# Patient Record
Sex: Male | Born: 1940
Health system: Southern US, Community
[De-identification: ages and names within clinical notes are randomized; demographics above are authoritative.]

## PROBLEM LIST (undated history)

## (undated) DIAGNOSIS — E785 Hyperlipidemia, unspecified: Secondary | ICD-10-CM

## (undated) DIAGNOSIS — F329 Major depressive disorder, single episode, unspecified: Secondary | ICD-10-CM

## (undated) DIAGNOSIS — F419 Anxiety disorder, unspecified: Secondary | ICD-10-CM

## (undated) DIAGNOSIS — G4733 Obstructive sleep apnea (adult) (pediatric): Secondary | ICD-10-CM

## (undated) DIAGNOSIS — Z972 Presence of dental prosthetic device (complete) (partial): Secondary | ICD-10-CM

## (undated) DIAGNOSIS — I10 Essential (primary) hypertension: Secondary | ICD-10-CM

## (undated) DIAGNOSIS — Z8781 Personal history of (healed) traumatic fracture: Secondary | ICD-10-CM

## (undated) DIAGNOSIS — Z9989 Dependence on other enabling machines and devices: Secondary | ICD-10-CM

## (undated) DIAGNOSIS — I447 Left bundle-branch block, unspecified: Secondary | ICD-10-CM

## (undated) DIAGNOSIS — H3530 Unspecified macular degeneration: Secondary | ICD-10-CM

## (undated) DIAGNOSIS — Z973 Presence of spectacles and contact lenses: Secondary | ICD-10-CM

## (undated) DIAGNOSIS — K219 Gastro-esophageal reflux disease without esophagitis: Secondary | ICD-10-CM

## (undated) DIAGNOSIS — K08109 Complete loss of teeth, unspecified cause, unspecified class: Secondary | ICD-10-CM

## (undated) DIAGNOSIS — C61 Malignant neoplasm of prostate: Secondary | ICD-10-CM

## (undated) DIAGNOSIS — Z9221 Personal history of antineoplastic chemotherapy: Secondary | ICD-10-CM

## (undated) DIAGNOSIS — E291 Testicular hypofunction: Secondary | ICD-10-CM

## (undated) DIAGNOSIS — R399 Unspecified symptoms and signs involving the genitourinary system: Secondary | ICD-10-CM

## (undated) DIAGNOSIS — F32A Depression, unspecified: Secondary | ICD-10-CM

## (undated) DIAGNOSIS — E349 Endocrine disorder, unspecified: Secondary | ICD-10-CM

## (undated) DIAGNOSIS — N200 Calculus of kidney: Secondary | ICD-10-CM

## (undated) DIAGNOSIS — M1990 Unspecified osteoarthritis, unspecified site: Secondary | ICD-10-CM

## (undated) HISTORY — PX: OTHER SURGICAL HISTORY: SHX169

## (undated) HISTORY — PX: APPENDECTOMY: SHX54

## (undated) HISTORY — PX: CARPAL TUNNEL RELEASE: SHX101

## (undated) HISTORY — PX: SHOULDER SURGERY: SHX246

## (undated) HISTORY — PX: LUMBAR SPINE SURGERY: SHX701

## (undated) HISTORY — PX: TONSILLECTOMY AND ADENOIDECTOMY: SUR1326

## (undated) HISTORY — PX: EYE SURGERY: SHX253

---

## 1991-08-21 HISTORY — PX: RETROPUBIC PROSTATECTOMY: SUR1055

## 1998-01-11 ENCOUNTER — Other Ambulatory Visit: Admission: RE | Admit: 1998-01-11 | Discharge: 1998-01-11 | Payer: Self-pay | Admitting: Urology

## 2007-08-01 ENCOUNTER — Ambulatory Visit (HOSPITAL_BASED_OUTPATIENT_CLINIC_OR_DEPARTMENT_OTHER): Admission: RE | Admit: 2007-08-01 | Discharge: 2007-08-01 | Payer: Self-pay | Admitting: Urology

## 2007-08-01 ENCOUNTER — Encounter (INDEPENDENT_AMBULATORY_CARE_PROVIDER_SITE_OTHER): Payer: Self-pay | Admitting: Urology

## 2007-08-01 HISTORY — PX: RADICAL ORCHIECTOMY: SHX2285

## 2008-07-07 ENCOUNTER — Ambulatory Visit (HOSPITAL_COMMUNITY): Admission: RE | Admit: 2008-07-07 | Discharge: 2008-07-07 | Payer: Self-pay | Admitting: Urology

## 2010-11-15 ENCOUNTER — Ambulatory Visit (HOSPITAL_BASED_OUTPATIENT_CLINIC_OR_DEPARTMENT_OTHER)
Admission: RE | Admit: 2010-11-15 | Discharge: 2010-11-15 | Disposition: A | Discharge status: Home / Self Care | Payer: Medicare Other | Source: Ambulatory Visit | Attending: Orthopedic Surgery | Admitting: Orthopedic Surgery

## 2010-11-15 DIAGNOSIS — X58XXXA Exposure to other specified factors, initial encounter: Secondary | ICD-10-CM | POA: Insufficient documentation

## 2010-11-15 DIAGNOSIS — Z01812 Encounter for preprocedural laboratory examination: Secondary | ICD-10-CM | POA: Insufficient documentation

## 2010-11-15 DIAGNOSIS — IMO0002 Reserved for concepts with insufficient information to code with codable children: Secondary | ICD-10-CM | POA: Insufficient documentation

## 2010-11-15 DIAGNOSIS — Z01810 Encounter for preprocedural cardiovascular examination: Secondary | ICD-10-CM | POA: Insufficient documentation

## 2010-11-15 HISTORY — PX: KNEE ARTHROSCOPY: SUR90

## 2010-11-15 LAB — POCT I-STAT 4, (NA,K, GLUC, HGB,HCT)
Potassium: 3.8 mEq/L (ref 3.5–5.1)
Sodium: 143 mEq/L (ref 135–145)

## 2010-11-21 NOTE — Op Note (Signed)
  NAME:  Allen Rogers, Allen Rogers              ACCOUNT NO.:  [id number]  MEDICAL RECORD NO.:  [id number]           PATIENT TYPE:  AMB  LOCATION:                               FACILITY:  [facility]  PHYSICIAN:  Ollen Gross, M.D.    DATE OF BIRTH:  09-09-1940  DATE OF PROCEDURE:  11/15/2010 DATE OF DISCHARGE:                              OPERATIVE REPORT   PREOPERATIVE DIAGNOSIS:  Right knee medial meniscal tear.  POSTOPERATIVE DIAGNOSIS:  Right knee medial meniscal tear.  PROCEDURE:  Right knee arthroscopy with meniscal debridement.  SURGEON:  Ollen Gross, M.D.  ASSISTANT.:  None.  ANESTHESIA:  General.  ESTIMATED BLOOD LOSS:  Minimal.  DRAINS:  None.  COMPLICATIONS:  None.  CONDITION.:  Stable to recovery.  BRIEF CLINICAL NOTE:  Mr. Firkus is a 70 year old male who has several month history significant for right knee pain and mechanical symptoms. Exam and history suggested medial meniscal tear confirmed by MRI.  He presents for arthroscopy and debridement.  PROCEDURE IN DETAIL:  After successful administration of general anesthetic, a tourniquet was placed high on the right thigh and right lower extremity prepped and draped in the usual sterile fashion. Standard superomedial and inferolateral incisions were made, inflow cannula passed, superomedial camera passed inferolateral.  Arthroscopic visualization proceeds.  Undersurface of patella and trochlea had some grade 2 and 3 changes but no full-thickness defects and no unstable cartilage.  Medial and lateral gutters were visualized and there were no loose bodies.  Flexion valgus force was applied to knee and the medial compartment centered.  There was significant tear in the body and posterior horn of medial meniscus which is unstable.  Spinal needle was used to localize the inferomedial portal.  Small incision was made, dilator placed.  Meniscus was debrided back to stable base with baskets and 4.2 mm shaver and sealed off with  the ArthroCare device.  The medial femoral condyle and medial tibial plateau had some grade 2 change but no full-thickness defects and no unstable cartilage.  Intercondylar notch was visualized.  The ACL was normal.  Lateral compartment was entered and it looked normal.  Joints again inspected.  No other tears, defects or loose bodies noted.  Arthroscopic equipment was then removed from the inferior portals which were closed with interrupted 4-0 nylon.  A 20 cc of 0.25% Marcaine with epi injected through the inflow cannula and that was removed and that portal closed with nylon.  A bulky sterile dressing was then applied and he was awakened and transported to recovery in stable condition.     Ollen Gross, M.D.     FA/MEDQ  D:  11/15/2010  T:  11/15/2010  Job:  [id number]  Electronically Signed by Ollen Gross M.D. on 11/21/2010 07:14:49 AM

## 2011-01-02 NOTE — Op Note (Signed)
NAME:  Allen Rogers, Allen Rogers NO.:  [id number]   MEDICAL RECORD NO.:  [id number]          PATIENT TYPE:  AMB   LOCATION:  [facility]                         FACILITY:  [facility]   PHYSICIAN:  Heloise Purpura, MD      DATE OF BIRTH:  [birth year]   DATE OF PROCEDURE:  08/01/2007  DATE OF DISCHARGE:                               OPERATIVE REPORT   ATTENDING:  Heloise Purpura, MD.   ASSISTANT:  Dr. Allena Katz.   PREOPERATIVE DIAGNOSIS:  Prostate cancer.   POSTOPERATIVE DIAGNOSIS:  Prostate cancer.   PROCEDURE:  Bilateral simple orchiectomy    General   COMPLICATIONS:  None   EBL:  Minimal   INDICATIONS FOR PROCEDURE:  [name] is a 70 year old  gentleman with a history of prostate cancer status post radical  retropubic prostatectomy.  He has been on androgen deprivation  intermittently since approximately the late 90's for biochemically  recurrent prostate cancer.  Recently, he has demonstrated findings  consistent with the development of castrate-resistant disease.  After  discussion with him and the likelihood that he was going to require  longterm androgen deprivation the patient elected to undergo scrotal  orchiectomy which has been scheduled for him electively today.   PROCEDURE IN DETAIL:  The patient was brought back to the operating  room.  After performing a preoperative time out and receiving  preoperative antibiotics he was appropriately anesthetized with an LMA  anesthetic.  He was secured in the supine position on the operating room  table and after shaving his scrotum, he was prepped and draped in the  usual sterile fashion.  With his penis retracted superiorly a midline  raphe incision was made on the scrotal skin.  We then started by  identifying the left testicle and incising the dartos over it and  ultimately the tunica over it and delivering the testis into the  surgical field.  The testis was freed from the epididymis and the cord  was identified.   The cord was suture ligated in 3 separate steps and the  testicle was removed in its entirety.  There was no visible bleeding and  at this point the remaining tunica was closed over the remnant cord and  epididymis.  We then turned our attention to the right testicle.   The right testicle was identified under the dartos.  An incision was  made in the dartos through the tunica of the testis.  The testis was  delivered into the field and in much the same fashion the cord and  epididymis were identified and plains between it and the testicle were  freed.  At this point the cord structures were suture ligated in 3  separate ligations.  The stump was hemostatic.  The testis was sent for  analysis along with the left testicle.  At this point we closed the  tunica over the left cord and epididymis.  We then reapproximated the  dartos muscle and closed the skin.  Dermabond was placed over the wound and a pressure dressing was applied.  The penile retraction was removed and this ended the case.  Please note  Dr. Laverle Patter was present throughout the entirety of the case.  Estimated  blood loss minimal.  Urine output not recorded.  Drains none.  Specimens  bilateral testicles.     ______________________________  Allena Katz Dr      Heloise Purpura, MD  Electronically Signed    Hadley Pen  D:  08/01/2007  T:  08/01/2007  Job:  [id number]

## 2011-05-28 LAB — I-STAT 8, (EC8 V) (CONVERTED LAB)
BUN: 25 — ABNORMAL HIGH
HCT: 42
Hemoglobin: 14.3
Sodium: 139
pH, Ven: 7.43 — ABNORMAL HIGH

## 2011-09-04 DIAGNOSIS — G4700 Insomnia, unspecified: Secondary | ICD-10-CM | POA: Diagnosis not present

## 2011-09-04 DIAGNOSIS — M1990 Unspecified osteoarthritis, unspecified site: Secondary | ICD-10-CM | POA: Diagnosis not present

## 2011-09-04 DIAGNOSIS — C61 Malignant neoplasm of prostate: Secondary | ICD-10-CM | POA: Diagnosis not present

## 2011-09-04 DIAGNOSIS — Z6837 Body mass index (BMI) 37.0-37.9, adult: Secondary | ICD-10-CM | POA: Diagnosis not present

## 2011-09-04 DIAGNOSIS — R42 Dizziness and giddiness: Secondary | ICD-10-CM | POA: Diagnosis not present

## 2011-09-04 DIAGNOSIS — I10 Essential (primary) hypertension: Secondary | ICD-10-CM | POA: Diagnosis not present

## 2011-10-09 DIAGNOSIS — K219 Gastro-esophageal reflux disease without esophagitis: Secondary | ICD-10-CM | POA: Diagnosis not present

## 2011-10-09 DIAGNOSIS — Z6836 Body mass index (BMI) 36.0-36.9, adult: Secondary | ICD-10-CM | POA: Diagnosis not present

## 2011-10-09 DIAGNOSIS — I10 Essential (primary) hypertension: Secondary | ICD-10-CM | POA: Diagnosis not present

## 2011-10-09 DIAGNOSIS — C61 Malignant neoplasm of prostate: Secondary | ICD-10-CM | POA: Diagnosis not present

## 2011-10-09 DIAGNOSIS — E785 Hyperlipidemia, unspecified: Secondary | ICD-10-CM | POA: Diagnosis not present

## 2011-10-09 DIAGNOSIS — Z79899 Other long term (current) drug therapy: Secondary | ICD-10-CM | POA: Diagnosis not present

## 2011-10-16 DIAGNOSIS — C61 Malignant neoplasm of prostate: Secondary | ICD-10-CM | POA: Diagnosis not present

## 2011-10-16 DIAGNOSIS — E291 Testicular hypofunction: Secondary | ICD-10-CM | POA: Diagnosis not present

## 2011-11-05 DIAGNOSIS — R071 Chest pain on breathing: Secondary | ICD-10-CM | POA: Diagnosis not present

## 2011-11-05 DIAGNOSIS — G2581 Restless legs syndrome: Secondary | ICD-10-CM | POA: Diagnosis not present

## 2011-11-05 DIAGNOSIS — G4710 Hypersomnia, unspecified: Secondary | ICD-10-CM | POA: Diagnosis not present

## 2011-11-05 DIAGNOSIS — Z6836 Body mass index (BMI) 36.0-36.9, adult: Secondary | ICD-10-CM | POA: Diagnosis not present

## 2011-11-08 DIAGNOSIS — G4710 Hypersomnia, unspecified: Secondary | ICD-10-CM | POA: Diagnosis not present

## 2011-11-08 DIAGNOSIS — G4730 Sleep apnea, unspecified: Secondary | ICD-10-CM | POA: Diagnosis not present

## 2011-11-20 DIAGNOSIS — R5383 Other fatigue: Secondary | ICD-10-CM | POA: Diagnosis not present

## 2011-11-20 DIAGNOSIS — G4710 Hypersomnia, unspecified: Secondary | ICD-10-CM | POA: Diagnosis not present

## 2011-11-20 DIAGNOSIS — J310 Chronic rhinitis: Secondary | ICD-10-CM | POA: Diagnosis not present

## 2011-11-20 DIAGNOSIS — R0989 Other specified symptoms and signs involving the circulatory and respiratory systems: Secondary | ICD-10-CM | POA: Diagnosis not present

## 2011-11-20 DIAGNOSIS — E559 Vitamin D deficiency, unspecified: Secondary | ICD-10-CM | POA: Diagnosis not present

## 2011-11-20 DIAGNOSIS — G4730 Sleep apnea, unspecified: Secondary | ICD-10-CM | POA: Diagnosis not present

## 2011-11-20 DIAGNOSIS — R5381 Other malaise: Secondary | ICD-10-CM | POA: Diagnosis not present

## 2011-11-20 DIAGNOSIS — R0609 Other forms of dyspnea: Secondary | ICD-10-CM | POA: Diagnosis not present

## 2011-11-27 DIAGNOSIS — G4730 Sleep apnea, unspecified: Secondary | ICD-10-CM | POA: Diagnosis not present

## 2011-12-24 DIAGNOSIS — M779 Enthesopathy, unspecified: Secondary | ICD-10-CM | POA: Diagnosis not present

## 2011-12-25 DIAGNOSIS — M25569 Pain in unspecified knee: Secondary | ICD-10-CM | POA: Diagnosis not present

## 2012-01-02 DIAGNOSIS — G4733 Obstructive sleep apnea (adult) (pediatric): Secondary | ICD-10-CM | POA: Diagnosis not present

## 2012-01-15 DIAGNOSIS — G4730 Sleep apnea, unspecified: Secondary | ICD-10-CM | POA: Diagnosis not present

## 2012-01-15 DIAGNOSIS — R0609 Other forms of dyspnea: Secondary | ICD-10-CM | POA: Diagnosis not present

## 2012-01-15 DIAGNOSIS — R0989 Other specified symptoms and signs involving the circulatory and respiratory systems: Secondary | ICD-10-CM | POA: Diagnosis not present

## 2012-01-15 DIAGNOSIS — R5381 Other malaise: Secondary | ICD-10-CM | POA: Diagnosis not present

## 2012-01-15 DIAGNOSIS — G4710 Hypersomnia, unspecified: Secondary | ICD-10-CM | POA: Diagnosis not present

## 2012-01-15 DIAGNOSIS — G2581 Restless legs syndrome: Secondary | ICD-10-CM | POA: Diagnosis not present

## 2012-01-16 DIAGNOSIS — G4710 Hypersomnia, unspecified: Secondary | ICD-10-CM | POA: Diagnosis not present

## 2012-01-16 DIAGNOSIS — G4730 Sleep apnea, unspecified: Secondary | ICD-10-CM | POA: Diagnosis not present

## 2012-02-15 DIAGNOSIS — G4710 Hypersomnia, unspecified: Secondary | ICD-10-CM | POA: Diagnosis not present

## 2012-02-15 DIAGNOSIS — J449 Chronic obstructive pulmonary disease, unspecified: Secondary | ICD-10-CM | POA: Diagnosis not present

## 2012-02-15 DIAGNOSIS — G4730 Sleep apnea, unspecified: Secondary | ICD-10-CM | POA: Diagnosis not present

## 2012-02-15 DIAGNOSIS — G2581 Restless legs syndrome: Secondary | ICD-10-CM | POA: Diagnosis not present

## 2012-02-15 DIAGNOSIS — R5381 Other malaise: Secondary | ICD-10-CM | POA: Diagnosis not present

## 2012-02-15 DIAGNOSIS — R5383 Other fatigue: Secondary | ICD-10-CM | POA: Diagnosis not present

## 2012-02-18 DIAGNOSIS — G2581 Restless legs syndrome: Secondary | ICD-10-CM | POA: Diagnosis not present

## 2012-02-18 DIAGNOSIS — R5381 Other malaise: Secondary | ICD-10-CM | POA: Diagnosis not present

## 2012-02-18 DIAGNOSIS — J449 Chronic obstructive pulmonary disease, unspecified: Secondary | ICD-10-CM | POA: Diagnosis not present

## 2012-02-18 DIAGNOSIS — G4730 Sleep apnea, unspecified: Secondary | ICD-10-CM | POA: Diagnosis not present

## 2012-02-27 DIAGNOSIS — M899 Disorder of bone, unspecified: Secondary | ICD-10-CM | POA: Diagnosis not present

## 2012-02-27 DIAGNOSIS — C61 Malignant neoplasm of prostate: Secondary | ICD-10-CM | POA: Diagnosis not present

## 2012-02-27 DIAGNOSIS — E291 Testicular hypofunction: Secondary | ICD-10-CM | POA: Diagnosis not present

## 2012-03-06 DIAGNOSIS — G4730 Sleep apnea, unspecified: Secondary | ICD-10-CM | POA: Diagnosis not present

## 2012-03-06 DIAGNOSIS — R5381 Other malaise: Secondary | ICD-10-CM | POA: Diagnosis not present

## 2012-03-06 DIAGNOSIS — G2581 Restless legs syndrome: Secondary | ICD-10-CM | POA: Diagnosis not present

## 2012-03-06 DIAGNOSIS — J310 Chronic rhinitis: Secondary | ICD-10-CM | POA: Diagnosis not present

## 2012-03-06 DIAGNOSIS — R5383 Other fatigue: Secondary | ICD-10-CM | POA: Diagnosis not present

## 2012-03-07 DIAGNOSIS — G2581 Restless legs syndrome: Secondary | ICD-10-CM | POA: Diagnosis not present

## 2012-03-07 DIAGNOSIS — R5381 Other malaise: Secondary | ICD-10-CM | POA: Diagnosis not present

## 2012-03-07 DIAGNOSIS — G4710 Hypersomnia, unspecified: Secondary | ICD-10-CM | POA: Diagnosis not present

## 2012-03-07 DIAGNOSIS — J310 Chronic rhinitis: Secondary | ICD-10-CM | POA: Diagnosis not present

## 2012-04-08 DIAGNOSIS — C61 Malignant neoplasm of prostate: Secondary | ICD-10-CM | POA: Diagnosis not present

## 2012-04-08 DIAGNOSIS — E785 Hyperlipidemia, unspecified: Secondary | ICD-10-CM | POA: Diagnosis not present

## 2012-04-08 DIAGNOSIS — G894 Chronic pain syndrome: Secondary | ICD-10-CM | POA: Diagnosis not present

## 2012-04-08 DIAGNOSIS — K219 Gastro-esophageal reflux disease without esophagitis: Secondary | ICD-10-CM | POA: Diagnosis not present

## 2012-04-08 DIAGNOSIS — I10 Essential (primary) hypertension: Secondary | ICD-10-CM | POA: Diagnosis not present

## 2012-04-08 DIAGNOSIS — G2581 Restless legs syndrome: Secondary | ICD-10-CM | POA: Diagnosis not present

## 2012-07-16 DIAGNOSIS — C61 Malignant neoplasm of prostate: Secondary | ICD-10-CM | POA: Diagnosis not present

## 2012-09-11 DIAGNOSIS — C61 Malignant neoplasm of prostate: Secondary | ICD-10-CM | POA: Diagnosis not present

## 2012-10-10 DIAGNOSIS — K219 Gastro-esophageal reflux disease without esophagitis: Secondary | ICD-10-CM | POA: Diagnosis not present

## 2012-10-10 DIAGNOSIS — C61 Malignant neoplasm of prostate: Secondary | ICD-10-CM | POA: Diagnosis not present

## 2012-10-10 DIAGNOSIS — I10 Essential (primary) hypertension: Secondary | ICD-10-CM | POA: Diagnosis not present

## 2012-10-10 DIAGNOSIS — E785 Hyperlipidemia, unspecified: Secondary | ICD-10-CM | POA: Diagnosis not present

## 2012-11-28 ENCOUNTER — Other Ambulatory Visit (HOSPITAL_COMMUNITY): Payer: Self-pay | Admitting: Urology

## 2012-11-28 DIAGNOSIS — C61 Malignant neoplasm of prostate: Secondary | ICD-10-CM

## 2012-11-28 DIAGNOSIS — N3941 Urge incontinence: Secondary | ICD-10-CM | POA: Diagnosis not present

## 2012-11-28 DIAGNOSIS — M949 Disorder of cartilage, unspecified: Secondary | ICD-10-CM | POA: Diagnosis not present

## 2012-11-28 DIAGNOSIS — M899 Disorder of bone, unspecified: Secondary | ICD-10-CM | POA: Diagnosis not present

## 2012-12-11 ENCOUNTER — Encounter (HOSPITAL_COMMUNITY)
Admission: RE | Admit: 2012-12-11 | Discharge: 2012-12-11 | Disposition: A | Discharge status: Home / Self Care | Payer: Medicare Other | Source: Ambulatory Visit | Attending: Urology | Admitting: Urology

## 2012-12-11 DIAGNOSIS — M25529 Pain in unspecified elbow: Secondary | ICD-10-CM | POA: Diagnosis not present

## 2012-12-11 DIAGNOSIS — C61 Malignant neoplasm of prostate: Secondary | ICD-10-CM | POA: Insufficient documentation

## 2012-12-11 DIAGNOSIS — M25539 Pain in unspecified wrist: Secondary | ICD-10-CM | POA: Diagnosis not present

## 2012-12-11 DIAGNOSIS — M25569 Pain in unspecified knee: Secondary | ICD-10-CM | POA: Diagnosis not present

## 2012-12-11 DIAGNOSIS — K7689 Other specified diseases of liver: Secondary | ICD-10-CM | POA: Diagnosis not present

## 2012-12-11 MED ORDER — TECHNETIUM TC 99M MEDRONATE IV KIT
25.6000 | PACK | Freq: Once | INTRAVENOUS | Status: AC | PRN
  Administered 2012-12-11: 25.6 via INTRAVENOUS

## 2013-02-23 DIAGNOSIS — K219 Gastro-esophageal reflux disease without esophagitis: Secondary | ICD-10-CM | POA: Diagnosis not present

## 2013-02-23 DIAGNOSIS — E785 Hyperlipidemia, unspecified: Secondary | ICD-10-CM | POA: Diagnosis not present

## 2013-02-23 DIAGNOSIS — I10 Essential (primary) hypertension: Secondary | ICD-10-CM | POA: Diagnosis not present

## 2013-02-23 DIAGNOSIS — C61 Malignant neoplasm of prostate: Secondary | ICD-10-CM | POA: Diagnosis not present

## 2013-02-24 DIAGNOSIS — C61 Malignant neoplasm of prostate: Secondary | ICD-10-CM | POA: Diagnosis not present

## 2013-04-16 DIAGNOSIS — E785 Hyperlipidemia, unspecified: Secondary | ICD-10-CM | POA: Diagnosis not present

## 2013-04-16 DIAGNOSIS — Z6837 Body mass index (BMI) 37.0-37.9, adult: Secondary | ICD-10-CM | POA: Diagnosis not present

## 2013-04-16 DIAGNOSIS — Z9181 History of falling: Secondary | ICD-10-CM | POA: Diagnosis not present

## 2013-04-16 DIAGNOSIS — I10 Essential (primary) hypertension: Secondary | ICD-10-CM | POA: Diagnosis not present

## 2013-04-16 DIAGNOSIS — Z1331 Encounter for screening for depression: Secondary | ICD-10-CM | POA: Diagnosis not present

## 2013-04-16 DIAGNOSIS — K219 Gastro-esophageal reflux disease without esophagitis: Secondary | ICD-10-CM | POA: Diagnosis not present

## 2013-05-21 DIAGNOSIS — C61 Malignant neoplasm of prostate: Secondary | ICD-10-CM | POA: Diagnosis not present

## 2013-05-21 DIAGNOSIS — E785 Hyperlipidemia, unspecified: Secondary | ICD-10-CM | POA: Diagnosis not present

## 2013-06-03 DIAGNOSIS — M899 Disorder of bone, unspecified: Secondary | ICD-10-CM | POA: Diagnosis not present

## 2013-06-03 DIAGNOSIS — E291 Testicular hypofunction: Secondary | ICD-10-CM | POA: Diagnosis not present

## 2013-06-03 DIAGNOSIS — C61 Malignant neoplasm of prostate: Secondary | ICD-10-CM | POA: Diagnosis not present

## 2013-06-17 ENCOUNTER — Other Ambulatory Visit (HOSPITAL_COMMUNITY): Payer: Self-pay | Admitting: Urology

## 2013-06-17 DIAGNOSIS — C61 Malignant neoplasm of prostate: Secondary | ICD-10-CM

## 2013-06-23 DIAGNOSIS — Z23 Encounter for immunization: Secondary | ICD-10-CM | POA: Diagnosis not present

## 2013-06-29 ENCOUNTER — Ambulatory Visit (HOSPITAL_COMMUNITY)
Admission: RE | Admit: 2013-06-29 | Discharge: 2013-06-29 | Disposition: A | Discharge status: Home / Self Care | Payer: Medicare Other | Source: Ambulatory Visit | Attending: Urology | Admitting: Urology

## 2013-06-29 ENCOUNTER — Encounter: Payer: Self-pay | Admitting: Cardiovascular Disease

## 2013-06-29 ENCOUNTER — Encounter (HOSPITAL_COMMUNITY): Payer: Self-pay

## 2013-06-29 DIAGNOSIS — C61 Malignant neoplasm of prostate: Secondary | ICD-10-CM | POA: Insufficient documentation

## 2013-06-29 DIAGNOSIS — R51 Headache: Secondary | ICD-10-CM | POA: Insufficient documentation

## 2013-06-29 MED ORDER — IOHEXOL 300 MG/ML  SOLN
100.0000 mL | Freq: Once | INTRAMUSCULAR | Status: AC | PRN
  Administered 2013-06-29: 100 mL via INTRAVENOUS

## 2013-07-02 ENCOUNTER — Encounter (HOSPITAL_COMMUNITY)
Admission: RE | Admit: 2013-07-02 | Discharge: 2013-07-02 | Disposition: A | Discharge status: Home / Self Care | Payer: Medicare Other | Source: Ambulatory Visit | Attending: Urology | Admitting: Urology

## 2013-07-02 ENCOUNTER — Encounter (HOSPITAL_COMMUNITY): Payer: Self-pay

## 2013-07-02 DIAGNOSIS — C61 Malignant neoplasm of prostate: Secondary | ICD-10-CM | POA: Diagnosis not present

## 2013-07-02 DIAGNOSIS — R948 Abnormal results of function studies of other organs and systems: Secondary | ICD-10-CM | POA: Insufficient documentation

## 2013-07-02 MED ORDER — TECHNETIUM TC 99M MEDRONATE IV KIT
26.2000 | PACK | Freq: Once | INTRAVENOUS | Status: AC | PRN
  Administered 2013-07-02: 26.2 via INTRAVENOUS

## 2013-09-08 ENCOUNTER — Other Ambulatory Visit (HOSPITAL_COMMUNITY): Payer: Self-pay | Admitting: Urology

## 2013-09-08 DIAGNOSIS — C61 Malignant neoplasm of prostate: Secondary | ICD-10-CM

## 2013-11-02 ENCOUNTER — Encounter (HOSPITAL_COMMUNITY)
Admission: RE | Admit: 2013-11-02 | Discharge: 2013-11-02 | Disposition: A | Discharge status: Home / Self Care | Payer: Medicare Other | Source: Ambulatory Visit | Attending: Urology | Admitting: Urology

## 2013-11-02 DIAGNOSIS — C7952 Secondary malignant neoplasm of bone marrow: Secondary | ICD-10-CM

## 2013-11-02 DIAGNOSIS — C61 Malignant neoplasm of prostate: Secondary | ICD-10-CM | POA: Insufficient documentation

## 2013-11-02 DIAGNOSIS — C7951 Secondary malignant neoplasm of bone: Secondary | ICD-10-CM | POA: Insufficient documentation

## 2013-11-02 MED ORDER — TECHNETIUM TC 99M MEDRONATE IV KIT
26.5000 | PACK | Freq: Once | INTRAVENOUS | Status: AC | PRN
  Administered 2013-11-02: 26.5 via INTRAVENOUS

## 2013-11-24 DIAGNOSIS — H353190 Nonexudative age-related macular degeneration, unspecified eye, stage unspecified: Secondary | ICD-10-CM | POA: Diagnosis not present

## 2013-11-24 DIAGNOSIS — H40009 Preglaucoma, unspecified, unspecified eye: Secondary | ICD-10-CM | POA: Diagnosis not present

## 2013-11-24 DIAGNOSIS — H25019 Cortical age-related cataract, unspecified eye: Secondary | ICD-10-CM | POA: Diagnosis not present

## 2013-12-31 ENCOUNTER — Other Ambulatory Visit (HOSPITAL_COMMUNITY): Payer: Self-pay | Admitting: Urology

## 2013-12-31 DIAGNOSIS — C61 Malignant neoplasm of prostate: Secondary | ICD-10-CM | POA: Diagnosis not present

## 2013-12-31 DIAGNOSIS — M899 Disorder of bone, unspecified: Secondary | ICD-10-CM | POA: Diagnosis not present

## 2013-12-31 DIAGNOSIS — M949 Disorder of cartilage, unspecified: Secondary | ICD-10-CM | POA: Diagnosis not present

## 2013-12-31 DIAGNOSIS — E291 Testicular hypofunction: Secondary | ICD-10-CM | POA: Diagnosis not present

## 2014-02-18 ENCOUNTER — Encounter (HOSPITAL_COMMUNITY)
Admission: RE | Admit: 2014-02-18 | Discharge: 2014-02-18 | Disposition: A | Discharge status: Home / Self Care | Payer: Medicare Other | Source: Ambulatory Visit | Attending: Urology | Admitting: Urology

## 2014-02-18 DIAGNOSIS — C61 Malignant neoplasm of prostate: Secondary | ICD-10-CM | POA: Diagnosis not present

## 2014-02-18 MED ORDER — TECHNETIUM TC 99M MEDRONATE IV KIT
25.0000 | PACK | Freq: Once | INTRAVENOUS | Status: AC | PRN
  Administered 2014-02-18: 25 via INTRAVENOUS

## 2014-04-12 ENCOUNTER — Other Ambulatory Visit (HOSPITAL_COMMUNITY): Payer: Self-pay | Admitting: Urology

## 2014-04-12 DIAGNOSIS — C61 Malignant neoplasm of prostate: Secondary | ICD-10-CM

## 2014-06-14 ENCOUNTER — Encounter (HOSPITAL_COMMUNITY)
Admission: RE | Admit: 2014-06-14 | Discharge: 2014-06-14 | Disposition: A | Discharge status: Home / Self Care | Payer: Medicare Other | Source: Ambulatory Visit | Attending: Urology | Admitting: Urology

## 2014-06-14 DIAGNOSIS — C61 Malignant neoplasm of prostate: Secondary | ICD-10-CM | POA: Insufficient documentation

## 2014-06-14 MED ORDER — TECHNETIUM TC 99M MEDRONATE IV KIT
24.8000 | PACK | Freq: Once | INTRAVENOUS | Status: AC | PRN
  Administered 2014-06-14: 24.8 via INTRAVENOUS

## 2014-06-15 ENCOUNTER — Other Ambulatory Visit (HOSPITAL_COMMUNITY): Payer: Self-pay | Admitting: Urology

## 2014-06-15 DIAGNOSIS — C61 Malignant neoplasm of prostate: Secondary | ICD-10-CM

## 2014-06-24 DIAGNOSIS — C61 Malignant neoplasm of prostate: Secondary | ICD-10-CM | POA: Diagnosis not present

## 2014-06-24 DIAGNOSIS — G4700 Insomnia, unspecified: Secondary | ICD-10-CM | POA: Diagnosis not present

## 2014-06-24 DIAGNOSIS — I10 Essential (primary) hypertension: Secondary | ICD-10-CM | POA: Diagnosis not present

## 2014-06-24 DIAGNOSIS — Z6837 Body mass index (BMI) 37.0-37.9, adult: Secondary | ICD-10-CM | POA: Diagnosis not present

## 2014-06-24 DIAGNOSIS — E785 Hyperlipidemia, unspecified: Secondary | ICD-10-CM | POA: Diagnosis not present

## 2014-06-24 DIAGNOSIS — E559 Vitamin D deficiency, unspecified: Secondary | ICD-10-CM | POA: Diagnosis not present

## 2014-06-24 DIAGNOSIS — Z23 Encounter for immunization: Secondary | ICD-10-CM | POA: Diagnosis not present

## 2014-06-24 DIAGNOSIS — G894 Chronic pain syndrome: Secondary | ICD-10-CM | POA: Diagnosis not present

## 2014-10-04 ENCOUNTER — Encounter (HOSPITAL_COMMUNITY)
Admission: RE | Admit: 2014-10-04 | Discharge: 2014-10-04 | Disposition: A | Discharge status: Home / Self Care | Payer: Self-pay | Source: Ambulatory Visit | Attending: Urology | Admitting: Urology

## 2014-10-04 DIAGNOSIS — R911 Solitary pulmonary nodule: Secondary | ICD-10-CM | POA: Diagnosis not present

## 2014-10-04 DIAGNOSIS — C61 Malignant neoplasm of prostate: Secondary | ICD-10-CM

## 2014-10-04 DIAGNOSIS — Z9079 Acquired absence of other genital organ(s): Secondary | ICD-10-CM | POA: Diagnosis not present

## 2014-10-04 DIAGNOSIS — J9811 Atelectasis: Secondary | ICD-10-CM | POA: Diagnosis not present

## 2014-10-04 DIAGNOSIS — D7389 Other diseases of spleen: Secondary | ICD-10-CM | POA: Diagnosis not present

## 2014-10-04 DIAGNOSIS — I700 Atherosclerosis of aorta: Secondary | ICD-10-CM | POA: Diagnosis not present

## 2014-10-04 MED ORDER — TECHNETIUM TC 99M MEDRONATE IV KIT
26.6000 | PACK | Freq: Once | INTRAVENOUS | Status: AC | PRN
  Administered 2014-10-04: 26.6 via INTRAVENOUS

## 2014-12-03 ENCOUNTER — Other Ambulatory Visit (HOSPITAL_COMMUNITY): Payer: Self-pay | Admitting: Urology

## 2014-12-03 DIAGNOSIS — C61 Malignant neoplasm of prostate: Secondary | ICD-10-CM

## 2014-12-10 DIAGNOSIS — Z79899 Other long term (current) drug therapy: Secondary | ICD-10-CM | POA: Diagnosis not present

## 2014-12-10 DIAGNOSIS — I10 Essential (primary) hypertension: Secondary | ICD-10-CM | POA: Diagnosis not present

## 2014-12-10 DIAGNOSIS — Z23 Encounter for immunization: Secondary | ICD-10-CM | POA: Diagnosis not present

## 2014-12-10 DIAGNOSIS — E559 Vitamin D deficiency, unspecified: Secondary | ICD-10-CM | POA: Diagnosis not present

## 2014-12-10 DIAGNOSIS — C61 Malignant neoplasm of prostate: Secondary | ICD-10-CM | POA: Diagnosis not present

## 2014-12-10 DIAGNOSIS — Z6837 Body mass index (BMI) 37.0-37.9, adult: Secondary | ICD-10-CM | POA: Diagnosis not present

## 2014-12-10 DIAGNOSIS — E785 Hyperlipidemia, unspecified: Secondary | ICD-10-CM | POA: Diagnosis not present

## 2014-12-10 DIAGNOSIS — G894 Chronic pain syndrome: Secondary | ICD-10-CM | POA: Diagnosis not present

## 2014-12-10 DIAGNOSIS — F5104 Psychophysiologic insomnia: Secondary | ICD-10-CM | POA: Diagnosis not present

## 2015-01-20 ENCOUNTER — Encounter (HOSPITAL_COMMUNITY)
Admission: RE | Admit: 2015-01-20 | Discharge: 2015-01-20 | Disposition: A | Discharge status: Home / Self Care | Payer: Self-pay | Source: Ambulatory Visit | Attending: Urology | Admitting: Urology

## 2015-01-20 DIAGNOSIS — C61 Malignant neoplasm of prostate: Secondary | ICD-10-CM | POA: Insufficient documentation

## 2015-01-20 MED ORDER — TECHNETIUM TC 99M MEDRONATE IV KIT
27.2000 | PACK | Freq: Once | INTRAVENOUS | Status: AC | PRN
  Administered 2015-01-20: 27.2 via INTRAVENOUS

## 2015-02-03 ENCOUNTER — Other Ambulatory Visit (HOSPITAL_COMMUNITY): Payer: Self-pay | Admitting: Urology

## 2015-02-03 DIAGNOSIS — C61 Malignant neoplasm of prostate: Secondary | ICD-10-CM

## 2015-02-11 DIAGNOSIS — H16102 Unspecified superficial keratitis, left eye: Secondary | ICD-10-CM | POA: Diagnosis not present

## 2015-03-17 DIAGNOSIS — M25512 Pain in left shoulder: Secondary | ICD-10-CM | POA: Diagnosis not present

## 2015-03-17 DIAGNOSIS — M25561 Pain in right knee: Secondary | ICD-10-CM | POA: Diagnosis not present

## 2015-03-17 DIAGNOSIS — M25562 Pain in left knee: Secondary | ICD-10-CM | POA: Diagnosis not present

## 2015-03-17 DIAGNOSIS — M25511 Pain in right shoulder: Secondary | ICD-10-CM | POA: Diagnosis not present

## 2015-03-25 DIAGNOSIS — M19011 Primary osteoarthritis, right shoulder: Secondary | ICD-10-CM | POA: Diagnosis not present

## 2015-03-25 DIAGNOSIS — X58XXXA Exposure to other specified factors, initial encounter: Secondary | ICD-10-CM | POA: Diagnosis not present

## 2015-03-25 DIAGNOSIS — S46111A Strain of muscle, fascia and tendon of long head of biceps, right arm, initial encounter: Secondary | ICD-10-CM | POA: Diagnosis not present

## 2015-03-25 DIAGNOSIS — M75101 Unspecified rotator cuff tear or rupture of right shoulder, not specified as traumatic: Secondary | ICD-10-CM | POA: Diagnosis not present

## 2015-03-31 DIAGNOSIS — M19011 Primary osteoarthritis, right shoulder: Secondary | ICD-10-CM | POA: Diagnosis not present

## 2015-03-31 DIAGNOSIS — M75121 Complete rotator cuff tear or rupture of right shoulder, not specified as traumatic: Secondary | ICD-10-CM | POA: Diagnosis not present

## 2015-05-16 ENCOUNTER — Encounter (HOSPITAL_COMMUNITY)
Admission: RE | Admit: 2015-05-16 | Discharge: 2015-05-16 | Disposition: A | Discharge status: Home / Self Care | Payer: Self-pay | Source: Ambulatory Visit | Attending: Urology | Admitting: Urology

## 2015-05-16 DIAGNOSIS — C61 Malignant neoplasm of prostate: Secondary | ICD-10-CM | POA: Insufficient documentation

## 2015-05-16 MED ORDER — TECHNETIUM TC 99M MEDRONATE IV KIT
26.6000 | PACK | Freq: Once | INTRAVENOUS | Status: AC | PRN
  Administered 2015-05-16: 26.6 via INTRAVENOUS

## 2015-05-17 DIAGNOSIS — Z23 Encounter for immunization: Secondary | ICD-10-CM | POA: Diagnosis not present

## 2015-05-18 DIAGNOSIS — M8580 Other specified disorders of bone density and structure, unspecified site: Secondary | ICD-10-CM | POA: Diagnosis not present

## 2015-05-18 DIAGNOSIS — C61 Malignant neoplasm of prostate: Secondary | ICD-10-CM | POA: Diagnosis not present

## 2015-05-23 DIAGNOSIS — M65811 Other synovitis and tenosynovitis, right shoulder: Secondary | ICD-10-CM | POA: Diagnosis not present

## 2015-05-23 DIAGNOSIS — M25511 Pain in right shoulder: Secondary | ICD-10-CM | POA: Diagnosis not present

## 2015-05-23 DIAGNOSIS — M25512 Pain in left shoulder: Secondary | ICD-10-CM | POA: Diagnosis not present

## 2015-05-23 DIAGNOSIS — M7551 Bursitis of right shoulder: Secondary | ICD-10-CM | POA: Diagnosis not present

## 2015-05-23 DIAGNOSIS — M7541 Impingement syndrome of right shoulder: Secondary | ICD-10-CM | POA: Diagnosis not present

## 2015-05-23 DIAGNOSIS — M75121 Complete rotator cuff tear or rupture of right shoulder, not specified as traumatic: Secondary | ICD-10-CM | POA: Diagnosis not present

## 2015-05-23 DIAGNOSIS — M19011 Primary osteoarthritis, right shoulder: Secondary | ICD-10-CM | POA: Diagnosis not present

## 2015-05-23 DIAGNOSIS — Z87891 Personal history of nicotine dependence: Secondary | ICD-10-CM | POA: Diagnosis not present

## 2015-06-06 DIAGNOSIS — M7521 Bicipital tendinitis, right shoulder: Secondary | ICD-10-CM | POA: Diagnosis not present

## 2015-06-06 DIAGNOSIS — M7541 Impingement syndrome of right shoulder: Secondary | ICD-10-CM | POA: Diagnosis not present

## 2015-06-06 DIAGNOSIS — M75101 Unspecified rotator cuff tear or rupture of right shoulder, not specified as traumatic: Secondary | ICD-10-CM | POA: Diagnosis not present

## 2015-06-17 DIAGNOSIS — G894 Chronic pain syndrome: Secondary | ICD-10-CM | POA: Diagnosis not present

## 2015-06-17 DIAGNOSIS — Z79899 Other long term (current) drug therapy: Secondary | ICD-10-CM | POA: Diagnosis not present

## 2015-06-17 DIAGNOSIS — Z6836 Body mass index (BMI) 36.0-36.9, adult: Secondary | ICD-10-CM | POA: Diagnosis not present

## 2015-06-17 DIAGNOSIS — Z9181 History of falling: Secondary | ICD-10-CM | POA: Diagnosis not present

## 2015-06-17 DIAGNOSIS — E785 Hyperlipidemia, unspecified: Secondary | ICD-10-CM | POA: Diagnosis not present

## 2015-06-17 DIAGNOSIS — E559 Vitamin D deficiency, unspecified: Secondary | ICD-10-CM | POA: Diagnosis not present

## 2015-06-17 DIAGNOSIS — I10 Essential (primary) hypertension: Secondary | ICD-10-CM | POA: Diagnosis not present

## 2015-06-17 DIAGNOSIS — Z1389 Encounter for screening for other disorder: Secondary | ICD-10-CM | POA: Diagnosis not present

## 2015-06-17 DIAGNOSIS — F5104 Psychophysiologic insomnia: Secondary | ICD-10-CM | POA: Diagnosis not present

## 2015-06-28 ENCOUNTER — Other Ambulatory Visit (HOSPITAL_COMMUNITY): Payer: Self-pay | Admitting: Urology

## 2015-06-28 DIAGNOSIS — C61 Malignant neoplasm of prostate: Secondary | ICD-10-CM

## 2015-07-26 DIAGNOSIS — G4733 Obstructive sleep apnea (adult) (pediatric): Secondary | ICD-10-CM | POA: Diagnosis not present

## 2015-07-26 DIAGNOSIS — J4520 Mild intermittent asthma, uncomplicated: Secondary | ICD-10-CM | POA: Diagnosis not present

## 2015-07-26 DIAGNOSIS — G2581 Restless legs syndrome: Secondary | ICD-10-CM | POA: Diagnosis not present

## 2015-07-26 DIAGNOSIS — R5383 Other fatigue: Secondary | ICD-10-CM | POA: Diagnosis not present

## 2015-09-01 ENCOUNTER — Encounter (HOSPITAL_COMMUNITY)
Admission: RE | Admit: 2015-09-01 | Discharge: 2015-09-01 | Disposition: A | Discharge status: Home / Self Care | Payer: Self-pay | Source: Ambulatory Visit | Attending: Urology | Admitting: Urology

## 2015-09-01 DIAGNOSIS — C61 Malignant neoplasm of prostate: Secondary | ICD-10-CM | POA: Diagnosis not present

## 2015-09-01 MED ORDER — TECHNETIUM TC 99M MEDRONATE IV KIT
25.6000 | PACK | Freq: Once | INTRAVENOUS | Status: AC | PRN
  Administered 2015-09-01: 25.6 via INTRAVENOUS

## 2015-09-08 ENCOUNTER — Other Ambulatory Visit: Payer: Self-pay | Admitting: Urology

## 2015-09-08 ENCOUNTER — Other Ambulatory Visit (HOSPITAL_COMMUNITY): Payer: Self-pay | Admitting: Urology

## 2015-09-08 DIAGNOSIS — C61 Malignant neoplasm of prostate: Secondary | ICD-10-CM

## 2015-09-08 DIAGNOSIS — M8580 Other specified disorders of bone density and structure, unspecified site: Secondary | ICD-10-CM

## 2015-10-11 DIAGNOSIS — Z87891 Personal history of nicotine dependence: Secondary | ICD-10-CM | POA: Diagnosis not present

## 2015-10-11 DIAGNOSIS — M75121 Complete rotator cuff tear or rupture of right shoulder, not specified as traumatic: Secondary | ICD-10-CM | POA: Diagnosis not present

## 2015-10-11 DIAGNOSIS — M7521 Bicipital tendinitis, right shoulder: Secondary | ICD-10-CM | POA: Diagnosis not present

## 2015-10-11 DIAGNOSIS — M25511 Pain in right shoulder: Secondary | ICD-10-CM | POA: Diagnosis not present

## 2015-12-26 ENCOUNTER — Encounter (HOSPITAL_COMMUNITY)
Admission: RE | Admit: 2015-12-26 | Discharge: 2015-12-26 | Disposition: A | Discharge status: Home / Self Care | Payer: Medicare Other | Source: Ambulatory Visit | Attending: Urology | Admitting: Urology

## 2015-12-26 DIAGNOSIS — C61 Malignant neoplasm of prostate: Secondary | ICD-10-CM | POA: Insufficient documentation

## 2015-12-26 MED ORDER — TECHNETIUM TC 99M MEDRONATE IV KIT
25.0000 | PACK | Freq: Once | INTRAVENOUS | Status: AC | PRN
  Administered 2015-12-26: 25.5 via INTRAVENOUS

## 2015-12-30 DIAGNOSIS — Z6836 Body mass index (BMI) 36.0-36.9, adult: Secondary | ICD-10-CM | POA: Diagnosis not present

## 2015-12-30 DIAGNOSIS — Z79891 Long term (current) use of opiate analgesic: Secondary | ICD-10-CM | POA: Diagnosis not present

## 2015-12-30 DIAGNOSIS — Z23 Encounter for immunization: Secondary | ICD-10-CM | POA: Diagnosis not present

## 2015-12-30 DIAGNOSIS — E669 Obesity, unspecified: Secondary | ICD-10-CM | POA: Diagnosis not present

## 2015-12-30 DIAGNOSIS — Z1211 Encounter for screening for malignant neoplasm of colon: Secondary | ICD-10-CM | POA: Diagnosis not present

## 2015-12-30 DIAGNOSIS — F5104 Psychophysiologic insomnia: Secondary | ICD-10-CM | POA: Diagnosis not present

## 2015-12-30 DIAGNOSIS — E785 Hyperlipidemia, unspecified: Secondary | ICD-10-CM | POA: Diagnosis not present

## 2015-12-30 DIAGNOSIS — E559 Vitamin D deficiency, unspecified: Secondary | ICD-10-CM | POA: Diagnosis not present

## 2015-12-30 DIAGNOSIS — I10 Essential (primary) hypertension: Secondary | ICD-10-CM | POA: Diagnosis not present

## 2015-12-30 DIAGNOSIS — G894 Chronic pain syndrome: Secondary | ICD-10-CM | POA: Diagnosis not present

## 2016-01-02 ENCOUNTER — Other Ambulatory Visit (HOSPITAL_COMMUNITY): Payer: Self-pay | Admitting: Urology

## 2016-01-02 DIAGNOSIS — C61 Malignant neoplasm of prostate: Secondary | ICD-10-CM

## 2016-01-03 ENCOUNTER — Inpatient Hospital Stay: Admission: RE | Admit: 2016-01-03 | Payer: Self-pay | Source: Ambulatory Visit

## 2016-01-18 ENCOUNTER — Ambulatory Visit
Admission: RE | Admit: 2016-01-18 | Discharge: 2016-01-18 | Disposition: A | Discharge status: Home / Self Care | Payer: Medicare Other | Source: Ambulatory Visit | Attending: Urology | Admitting: Urology

## 2016-01-18 DIAGNOSIS — M8580 Other specified disorders of bone density and structure, unspecified site: Secondary | ICD-10-CM

## 2016-01-18 DIAGNOSIS — C61 Malignant neoplasm of prostate: Secondary | ICD-10-CM

## 2016-01-18 DIAGNOSIS — M85851 Other specified disorders of bone density and structure, right thigh: Secondary | ICD-10-CM | POA: Diagnosis not present

## 2016-01-23 DIAGNOSIS — Z01818 Encounter for other preprocedural examination: Secondary | ICD-10-CM | POA: Diagnosis not present

## 2016-01-23 DIAGNOSIS — Z1211 Encounter for screening for malignant neoplasm of colon: Secondary | ICD-10-CM | POA: Diagnosis not present

## 2016-01-26 DIAGNOSIS — H353211 Exudative age-related macular degeneration, right eye, with active choroidal neovascularization: Secondary | ICD-10-CM | POA: Diagnosis not present

## 2016-01-26 DIAGNOSIS — H5203 Hypermetropia, bilateral: Secondary | ICD-10-CM | POA: Diagnosis not present

## 2016-01-26 DIAGNOSIS — H40053 Ocular hypertension, bilateral: Secondary | ICD-10-CM | POA: Diagnosis not present

## 2016-01-30 DIAGNOSIS — H40052 Ocular hypertension, left eye: Secondary | ICD-10-CM | POA: Diagnosis not present

## 2016-01-30 DIAGNOSIS — H401321 Pigmentary glaucoma, left eye, mild stage: Secondary | ICD-10-CM | POA: Diagnosis not present

## 2016-01-30 DIAGNOSIS — H353122 Nonexudative age-related macular degeneration, left eye, intermediate dry stage: Secondary | ICD-10-CM | POA: Diagnosis not present

## 2016-01-30 DIAGNOSIS — Q132 Other congenital malformations of iris: Secondary | ICD-10-CM | POA: Diagnosis not present

## 2016-01-30 DIAGNOSIS — H353211 Exudative age-related macular degeneration, right eye, with active choroidal neovascularization: Secondary | ICD-10-CM | POA: Diagnosis not present

## 2016-02-09 DIAGNOSIS — D126 Benign neoplasm of colon, unspecified: Secondary | ICD-10-CM | POA: Diagnosis not present

## 2016-02-09 DIAGNOSIS — Z1211 Encounter for screening for malignant neoplasm of colon: Secondary | ICD-10-CM | POA: Diagnosis not present

## 2016-02-09 DIAGNOSIS — Z86010 Personal history of colonic polyps: Secondary | ICD-10-CM | POA: Diagnosis not present

## 2016-02-09 DIAGNOSIS — D128 Benign neoplasm of rectum: Secondary | ICD-10-CM | POA: Diagnosis not present

## 2016-02-09 DIAGNOSIS — K621 Rectal polyp: Secondary | ICD-10-CM | POA: Diagnosis not present

## 2016-02-09 DIAGNOSIS — J449 Chronic obstructive pulmonary disease, unspecified: Secondary | ICD-10-CM | POA: Diagnosis not present

## 2016-02-09 DIAGNOSIS — I10 Essential (primary) hypertension: Secondary | ICD-10-CM | POA: Diagnosis not present

## 2016-02-09 DIAGNOSIS — Z8546 Personal history of malignant neoplasm of prostate: Secondary | ICD-10-CM | POA: Diagnosis not present

## 2016-02-09 DIAGNOSIS — K5730 Diverticulosis of large intestine without perforation or abscess without bleeding: Secondary | ICD-10-CM | POA: Diagnosis not present

## 2016-02-09 DIAGNOSIS — D122 Benign neoplasm of ascending colon: Secondary | ICD-10-CM | POA: Diagnosis not present

## 2016-02-29 DIAGNOSIS — H40052 Ocular hypertension, left eye: Secondary | ICD-10-CM | POA: Diagnosis not present

## 2016-02-29 DIAGNOSIS — Q132 Other congenital malformations of iris: Secondary | ICD-10-CM | POA: Diagnosis not present

## 2016-02-29 DIAGNOSIS — H401321 Pigmentary glaucoma, left eye, mild stage: Secondary | ICD-10-CM | POA: Diagnosis not present

## 2016-02-29 DIAGNOSIS — H353211 Exudative age-related macular degeneration, right eye, with active choroidal neovascularization: Secondary | ICD-10-CM | POA: Diagnosis not present

## 2016-02-29 DIAGNOSIS — H353122 Nonexudative age-related macular degeneration, left eye, intermediate dry stage: Secondary | ICD-10-CM | POA: Diagnosis not present

## 2016-03-05 DIAGNOSIS — H353211 Exudative age-related macular degeneration, right eye, with active choroidal neovascularization: Secondary | ICD-10-CM | POA: Diagnosis not present

## 2016-03-05 DIAGNOSIS — Q132 Other congenital malformations of iris: Secondary | ICD-10-CM | POA: Diagnosis not present

## 2016-03-05 DIAGNOSIS — H401321 Pigmentary glaucoma, left eye, mild stage: Secondary | ICD-10-CM | POA: Diagnosis not present

## 2016-03-05 DIAGNOSIS — H353122 Nonexudative age-related macular degeneration, left eye, intermediate dry stage: Secondary | ICD-10-CM | POA: Diagnosis not present

## 2016-03-05 DIAGNOSIS — H31012 Macula scars of posterior pole (postinflammatory) (post-traumatic), left eye: Secondary | ICD-10-CM | POA: Diagnosis not present

## 2016-04-02 DIAGNOSIS — H353211 Exudative age-related macular degeneration, right eye, with active choroidal neovascularization: Secondary | ICD-10-CM | POA: Diagnosis not present

## 2016-04-12 ENCOUNTER — Encounter (HOSPITAL_COMMUNITY)
Admission: RE | Admit: 2016-04-12 | Discharge: 2016-04-12 | Disposition: A | Discharge status: Home / Self Care | Payer: Medicare Other | Source: Ambulatory Visit | Attending: Urology | Admitting: Urology

## 2016-04-12 ENCOUNTER — Encounter (HOSPITAL_COMMUNITY): Admission: RE | Admit: 2016-04-12 | Payer: Medicare Other | Source: Ambulatory Visit

## 2016-04-12 DIAGNOSIS — C61 Malignant neoplasm of prostate: Secondary | ICD-10-CM | POA: Insufficient documentation

## 2016-04-12 DIAGNOSIS — N201 Calculus of ureter: Secondary | ICD-10-CM | POA: Diagnosis not present

## 2016-04-12 MED ORDER — TECHNETIUM TC 99M MEDRONATE IV KIT
20.4000 | PACK | Freq: Once | INTRAVENOUS | Status: AC | PRN
  Administered 2016-04-12: 20.4 via INTRAVENOUS

## 2016-04-17 ENCOUNTER — Ambulatory Visit (HOSPITAL_BASED_OUTPATIENT_CLINIC_OR_DEPARTMENT_OTHER)
Admission: RE | Admit: 2016-04-17 | Discharge: 2016-04-17 | Disposition: A | Discharge status: Home / Self Care | Payer: Medicare Other | Source: Ambulatory Visit | Attending: Urology | Admitting: Urology

## 2016-04-17 ENCOUNTER — Ambulatory Visit (HOSPITAL_BASED_OUTPATIENT_CLINIC_OR_DEPARTMENT_OTHER): Payer: Medicare Other | Admitting: Anesthesiology

## 2016-04-17 ENCOUNTER — Other Ambulatory Visit: Payer: Self-pay | Admitting: Urology

## 2016-04-17 ENCOUNTER — Encounter (HOSPITAL_BASED_OUTPATIENT_CLINIC_OR_DEPARTMENT_OTHER)
Admission: RE | Disposition: A | Discharge status: Home / Self Care | Payer: Self-pay | Source: Ambulatory Visit | Attending: Urology

## 2016-04-17 ENCOUNTER — Encounter (HOSPITAL_BASED_OUTPATIENT_CLINIC_OR_DEPARTMENT_OTHER): Payer: Self-pay | Admitting: *Deleted

## 2016-04-17 ENCOUNTER — Other Ambulatory Visit: Payer: Self-pay

## 2016-04-17 DIAGNOSIS — Z9079 Acquired absence of other genital organ(s): Secondary | ICD-10-CM | POA: Diagnosis not present

## 2016-04-17 DIAGNOSIS — Z6833 Body mass index (BMI) 33.0-33.9, adult: Secondary | ICD-10-CM | POA: Diagnosis not present

## 2016-04-17 DIAGNOSIS — Z87891 Personal history of nicotine dependence: Secondary | ICD-10-CM | POA: Diagnosis not present

## 2016-04-17 DIAGNOSIS — N201 Calculus of ureter: Secondary | ICD-10-CM | POA: Insufficient documentation

## 2016-04-17 DIAGNOSIS — N200 Calculus of kidney: Secondary | ICD-10-CM

## 2016-04-17 DIAGNOSIS — I10 Essential (primary) hypertension: Secondary | ICD-10-CM | POA: Diagnosis not present

## 2016-04-17 DIAGNOSIS — E7800 Pure hypercholesterolemia, unspecified: Secondary | ICD-10-CM | POA: Diagnosis not present

## 2016-04-17 DIAGNOSIS — N23 Unspecified renal colic: Secondary | ICD-10-CM | POA: Diagnosis not present

## 2016-04-17 DIAGNOSIS — N3941 Urge incontinence: Secondary | ICD-10-CM | POA: Diagnosis not present

## 2016-04-17 DIAGNOSIS — Z006 Encounter for examination for normal comparison and control in clinical research program: Secondary | ICD-10-CM | POA: Insufficient documentation

## 2016-04-17 DIAGNOSIS — E291 Testicular hypofunction: Secondary | ICD-10-CM | POA: Diagnosis not present

## 2016-04-17 DIAGNOSIS — Z79899 Other long term (current) drug therapy: Secondary | ICD-10-CM | POA: Insufficient documentation

## 2016-04-17 DIAGNOSIS — C61 Malignant neoplasm of prostate: Secondary | ICD-10-CM | POA: Insufficient documentation

## 2016-04-17 HISTORY — DX: Dependence on other enabling machines and devices: Z99.89

## 2016-04-17 HISTORY — DX: Unspecified macular degeneration: H35.30

## 2016-04-17 HISTORY — DX: Malignant neoplasm of prostate: C61

## 2016-04-17 HISTORY — DX: Personal history of (healed) traumatic fracture: Z87.81

## 2016-04-17 HISTORY — PX: CYSTOSCOPY WITH RETROGRADE PYELOGRAM, URETEROSCOPY AND STENT PLACEMENT: SHX5789

## 2016-04-17 HISTORY — DX: Hyperlipidemia, unspecified: E78.5

## 2016-04-17 HISTORY — DX: Unspecified symptoms and signs involving the genitourinary system: R39.9

## 2016-04-17 HISTORY — DX: Unspecified osteoarthritis, unspecified site: M19.90

## 2016-04-17 HISTORY — DX: Presence of spectacles and contact lenses: Z97.3

## 2016-04-17 HISTORY — DX: Presence of dental prosthetic device (complete) (partial): Z97.2

## 2016-04-17 HISTORY — DX: Left bundle-branch block, unspecified: I44.7

## 2016-04-17 HISTORY — DX: Testicular hypofunction: E29.1

## 2016-04-17 HISTORY — DX: Endocrine disorder, unspecified: E34.9

## 2016-04-17 HISTORY — DX: Complete loss of teeth, unspecified cause, unspecified class: K08.109

## 2016-04-17 HISTORY — DX: Essential (primary) hypertension: I10

## 2016-04-17 HISTORY — DX: Obstructive sleep apnea (adult) (pediatric): G47.33

## 2016-04-17 HISTORY — DX: Gastro-esophageal reflux disease without esophagitis: K21.9

## 2016-04-17 LAB — POCT I-STAT, CHEM 8
BUN: 45 mg/dL — AB (ref 6–20)
CALCIUM ION: 1.36 mmol/L (ref 1.15–1.40)
CHLORIDE: 102 mmol/L (ref 101–111)
CREATININE: 1.3 mg/dL — AB (ref 0.61–1.24)
Glucose, Bld: 111 mg/dL — ABNORMAL HIGH (ref 65–99)
HCT: 36 % — ABNORMAL LOW (ref 39.0–52.0)
Hemoglobin: 12.2 g/dL — ABNORMAL LOW (ref 13.0–17.0)
Potassium: 4.2 mmol/L (ref 3.5–5.1)
SODIUM: 147 mmol/L — AB (ref 135–145)
TCO2: 33 mmol/L (ref 0–100)

## 2016-04-17 SURGERY — CYSTOURETEROSCOPY, WITH RETROGRADE PYELOGRAM AND STENT INSERTION
Anesthesia: General | Site: Ureter | Laterality: Right

## 2016-04-17 MED ORDER — PHENYLEPHRINE 40 MCG/ML (10ML) SYRINGE FOR IV PUSH (FOR BLOOD PRESSURE SUPPORT)
PREFILLED_SYRINGE | INTRAVENOUS | Status: AC
  Filled 2016-04-17: qty 10

## 2016-04-17 MED ORDER — CEFAZOLIN SODIUM-DEXTROSE 2-3 GM-% IV SOLR
INTRAVENOUS | Status: DC | PRN
  Administered 2016-04-17: 2 g via INTRAVENOUS

## 2016-04-17 MED ORDER — ONDANSETRON HCL 4 MG/2ML IJ SOLN
INTRAMUSCULAR | Status: DC | PRN
Start: 2016-04-17 — End: 2016-04-17
  Administered 2016-04-17: 4 mg via INTRAVENOUS

## 2016-04-17 MED ORDER — PHENYLEPHRINE HCL 10 MG/ML IJ SOLN
INTRAMUSCULAR | Status: DC | PRN
  Administered 2016-04-17 (×2): 80 ug via INTRAVENOUS

## 2016-04-17 MED ORDER — BELLADONNA ALKALOIDS-OPIUM 16.2-60 MG RE SUPP
RECTAL | Status: AC
  Filled 2016-04-17: qty 1

## 2016-04-17 MED ORDER — MIDAZOLAM HCL 2 MG/2ML IJ SOLN
0.5000 mg | Freq: Once | INTRAMUSCULAR | Status: DC | PRN
  Filled 2016-04-17: qty 2

## 2016-04-17 MED ORDER — PHENAZOPYRIDINE HCL 200 MG PO TABS
200.0000 mg | ORAL_TABLET | Freq: Once | ORAL | Status: AC
  Administered 2016-04-17: 200 mg via ORAL
  Filled 2016-04-17: qty 1

## 2016-04-17 MED ORDER — PHENAZOPYRIDINE HCL 200 MG PO TABS: 200.0000 mg | ORAL_TABLET | Freq: Three times a day (TID) | ORAL | 0 refills | Status: AC | PRN

## 2016-04-17 MED ORDER — FENTANYL CITRATE (PF) 100 MCG/2ML IJ SOLN
INTRAMUSCULAR | Status: DC | PRN
  Administered 2016-04-17 (×2): 25 ug via INTRAVENOUS
  Administered 2016-04-17: 50 ug via INTRAVENOUS

## 2016-04-17 MED ORDER — SODIUM CHLORIDE 0.9 % IR SOLN
Status: DC | PRN
  Administered 2016-04-17: 4000 mL

## 2016-04-17 MED ORDER — SUCCINYLCHOLINE CHLORIDE 20 MG/ML IJ SOLN
INTRAMUSCULAR | Status: DC | PRN
  Administered 2016-04-17: 80 mg via INTRAVENOUS

## 2016-04-17 MED ORDER — LIDOCAINE HCL 2 % EX GEL
CUTANEOUS | Status: DC | PRN
  Administered 2016-04-17: 1 via URETHRAL

## 2016-04-17 MED ORDER — PROPOFOL 10 MG/ML IV BOLUS
INTRAVENOUS | Status: DC | PRN
  Administered 2016-04-17: 160 mg via INTRAVENOUS
  Administered 2016-04-17: 50 mg via INTRAVENOUS
  Administered 2016-04-17: 40 mg via INTRAVENOUS
  Administered 2016-04-17: 50 mg via INTRAVENOUS

## 2016-04-17 MED ORDER — EPHEDRINE SULFATE 50 MG/ML IJ SOLN
INTRAMUSCULAR | Status: DC | PRN
  Administered 2016-04-17: 10 mg via INTRAVENOUS

## 2016-04-17 MED ORDER — FENTANYL CITRATE (PF) 100 MCG/2ML IJ SOLN
INTRAMUSCULAR | Status: AC
  Filled 2016-04-17: qty 2

## 2016-04-17 MED ORDER — DEXAMETHASONE SODIUM PHOSPHATE 4 MG/ML IJ SOLN
INTRAMUSCULAR | Status: DC | PRN
  Administered 2016-04-17: 10 mg via INTRAVENOUS

## 2016-04-17 MED ORDER — BELLADONNA ALKALOIDS-OPIUM 16.2-60 MG RE SUPP
RECTAL | Status: DC | PRN
  Administered 2016-04-17: 1 via RECTAL

## 2016-04-17 MED ORDER — CEFAZOLIN SODIUM-DEXTROSE 2-4 GM/100ML-% IV SOLN
INTRAVENOUS | Status: AC
  Filled 2016-04-17: qty 100

## 2016-04-17 MED ORDER — IOHEXOL 300 MG/ML  SOLN
INTRAMUSCULAR | Status: DC | PRN
  Administered 2016-04-17: 20 mL

## 2016-04-17 MED ORDER — LIDOCAINE 2% (20 MG/ML) 5 ML SYRINGE
INTRAMUSCULAR | Status: DC | PRN
  Administered 2016-04-17: 50 mg via INTRAVENOUS

## 2016-04-17 MED ORDER — FENTANYL CITRATE (PF) 100 MCG/2ML IJ SOLN
25.0000 ug | INTRAMUSCULAR | Status: DC | PRN
  Filled 2016-04-17: qty 1

## 2016-04-17 MED ORDER — ONDANSETRON HCL 4 MG/2ML IJ SOLN
4.0000 mg | Freq: Once | INTRAMUSCULAR | Status: AC
  Administered 2016-04-17: 4 mg via INTRAVENOUS
  Filled 2016-04-17: qty 2

## 2016-04-17 MED ORDER — MEPERIDINE HCL 25 MG/ML IJ SOLN
6.2500 mg | INTRAMUSCULAR | Status: DC | PRN
  Filled 2016-04-17: qty 1

## 2016-04-17 MED ORDER — PROMETHAZINE HCL 25 MG/ML IJ SOLN
6.2500 mg | INTRAMUSCULAR | Status: DC | PRN
  Filled 2016-04-17: qty 1

## 2016-04-17 MED ORDER — LIDOCAINE HCL (CARDIAC) 20 MG/ML IV SOLN
INTRAVENOUS | Status: AC
  Filled 2016-04-17: qty 5

## 2016-04-17 MED ORDER — LIDOCAINE HCL 2 % EX GEL
CUTANEOUS | Status: AC
  Filled 2016-04-17: qty 5

## 2016-04-17 MED ORDER — LACTATED RINGERS IV SOLN
INTRAVENOUS | Status: DC
  Administered 2016-04-17 (×2): via INTRAVENOUS
  Filled 2016-04-17: qty 1000

## 2016-04-17 MED ORDER — PHENAZOPYRIDINE HCL 100 MG PO TABS
ORAL_TABLET | ORAL | Status: AC
  Filled 2016-04-17: qty 2

## 2016-04-17 MED ORDER — ONDANSETRON HCL 4 MG/2ML IJ SOLN
INTRAMUSCULAR | Status: AC
  Filled 2016-04-17: qty 2

## 2016-04-17 MED ORDER — PROPOFOL 10 MG/ML IV BOLUS
INTRAVENOUS | Status: AC
  Filled 2016-04-17: qty 20

## 2016-04-17 MED ORDER — EPHEDRINE SULFATE 50 MG/ML IJ SOLN
INTRAMUSCULAR | Status: AC
  Filled 2016-04-17: qty 1

## 2016-04-17 SURGICAL SUPPLY — 31 items
BAG DRAIN URO-CYSTO SKYTR STRL (DRAIN) ×3 IMPLANT
BAG DRN UROCATH (DRAIN) ×1
BASKET DAKOTA 1.9FR 11X120 (BASKET) IMPLANT
CATH URET 5FR 28IN OPEN ENDED (CATHETERS) ×3 IMPLANT
CATH URET DUAL LUMEN 6-10FR 50 (CATHETERS) IMPLANT
CLOTH BEACON ORANGE TIMEOUT ST (SAFETY) ×3 IMPLANT
FIBER LASER TRAC TIP (UROLOGICAL SUPPLIES) IMPLANT
GLOVE BIO SURGEON STRL SZ7.5 (GLOVE) ×3 IMPLANT
GLOVE INDICATOR 7.5 STRL GRN (GLOVE) ×4 IMPLANT
GOWN STRL REUS W/ TWL XL LVL3 (GOWN DISPOSABLE) ×1 IMPLANT
GOWN STRL REUS W/TWL LRG LVL3 (GOWN DISPOSABLE) ×2 IMPLANT
GOWN STRL REUS W/TWL XL LVL3 (GOWN DISPOSABLE) ×3
GUIDEWIRE 0.038 PTFE COATED (WIRE) IMPLANT
GUIDEWIRE ANG ZIPWIRE 038X150 (WIRE) IMPLANT
GUIDEWIRE STR DUAL SENSOR (WIRE) ×6 IMPLANT
IV NS 1000ML (IV SOLUTION) ×3
IV NS 1000ML BAXH (IV SOLUTION) IMPLANT
IV NS IRRIG 3000ML ARTHROMATIC (IV SOLUTION) ×4 IMPLANT
KIT BALLIN UROMAX 15FX10 (LABEL) IMPLANT
KIT BALLN UROMAX 15FX4 (MISCELLANEOUS) IMPLANT
KIT BALLN UROMAX 26 75X4 (MISCELLANEOUS)
KIT ROOM TURNOVER WOR (KITS) ×3 IMPLANT
MANIFOLD NEPTUNE II (INSTRUMENTS) IMPLANT
NS IRRIG 500ML POUR BTL (IV SOLUTION) IMPLANT
PACK CYSTO (CUSTOM PROCEDURE TRAY) ×3 IMPLANT
SET HIGH PRES BAL DIL (LABEL)
SHEATH ACCESS URETERAL 38CM (SHEATH) IMPLANT
STENT URET 6FRX26 CONTOUR (STENTS) ×2 IMPLANT
TUBE CONNECTING 12'X1/4 (SUCTIONS)
TUBE CONNECTING 12X1/4 (SUCTIONS) IMPLANT
TUBE FEEDING 8FR 16IN STR KANG (MISCELLANEOUS) IMPLANT

## 2016-04-17 NOTE — Discharge Instructions (Signed)
°  Post Anesthesia Home Care Instructions  Activity: Get plenty of rest for the remainder of the day. A responsible adult should stay with you for 24 hours following the procedure.  For the next 24 hours, DO NOT: -Drive a car -Paediatric nurse -Drink alcoholic beverages -Take any medication unless instructed by your physician -Make any legal decisions or sign important papers.  Meals: Start with liquid foods such as gelatin or soup. Progress to regular foods as tolerated. Avoid greasy, spicy, heavy foods. If nausea and/or vomiting occur, drink only clear liquids until the nausea and/or vomiting subsides. Call your physician if vomiting continues.  Special Instructions/Symptoms: Your throat may feel dry or sore from the anesthesia or the breathing tube placed in your throat during surgery. If this causes discomfort, gargle with warm salt water. The discomfort should disappear within 24 hours.  If you had a scopolamine patch placed behind your ear for the management of post- operative nausea and/or vomiting:  1. The medication in the patch is effective for 72 hours, after which it should be removed.  Wrap patch in a tissue and discard in the trash. Wash hands thoroughly with soap and water. 2. You may remove the patch earlier than 72 hours if you experience unpleasant side effects which may include dry mouth, dizziness or visual disturbances. 3. Avoid touching the patch. Wash your hands with soap and water after contact with the patch.   DISCHARGE INSTRUCTIONS FOR KIDNEY STONE/URETERAL STENT   MEDICATIONS:  1.  Resume all your other meds from home - except do not take any extra narcotic pain meds that you may have at home.  2. Pyridium is to help with the burning/stinging when you urinate.   ACTIVITY:  1. No strenuous activity x 1week  2. No driving while on narcotic pain medications  3. Drink plenty of water  4. Continue to walk at home - you can still get blood clots when you are at  home, so keep active, but don't over do it.    SIGNS/SYMPTOMS TO CALL:  Please call us if you have a fever greater than 101.5, uncontrolled nausea/vomiting, uncontrolled pain, dizziness, unable to urinate, bloody urine, chest pain, shortness of breath, leg swelling, leg pain, redness around wound, drainage from wound, or any other concerns or questions.   You can reach [place] at [phone].   FOLLOW-UP:   1. You will be contacted with a follow-up date for repeat ureteroscopy and stone removal within the next several days.  REMOVE FOLEY IN AM AT HOME

## 2016-04-17 NOTE — Op Note (Signed)
Preoperative diagnosis:  1. Right ureteral stone with associated renal colic   Postoperative diagnosis:  1. Same   Procedure: 1. Cystoscopy with right retrograde pyelogram with interpretation 2. Right ureteroscopy 3. Right ureteral stent placement  Surgeon: Ardis Hughs, MD  Anesthesia: General  Complications: None  Intraoperative findings:  #1 10 cc of contrast was instilled through the open-ended ureteral catheter and right retrograde pyelogram performed.  This demonstrated a filling defect in the mid ureter with mild hydronephrosis proximally.  There were no other ureteral abnormalities. #2  The patient's urethral meatus was stenosed requiring dilation with Leander Rams sounds from 29 [place] to 26 [place]. #3 ureteroscopically, the ureter was noted to be friable and fairly noncompliant.  I was unable to get the rigid ureteroscope over the iliac vessels.  As such, the stone was never encountered. Having created a small false passage in the proximal ureter with the guidewire followed by a posterior ureteral laceration while attempting to navigate the distal ureter I opted at this point to a ureteral stent before any additional ureteral damage was incurred by the patient.   EBL: Minimal  Specimens: None  Indication: Allen Rogers is a 75 y.o. patient with Intractable nausea and vomiting secondary to an obstructing right mid ureteral stone.  After reviewing the management options for treatment, he elected to proceed with the above surgical procedure(s). We have discussed the potential benefits and risks of the procedure, side effects of the proposed treatment, the likelihood of the patient achieving the goals of the procedure, and any potential problems that might occur during the procedure or recuperation. Informed consent has been obtained.  Description of procedure:  The patient was taken to the operating room and general anesthesia was induced.  The patient was placed in the  dorsal lithotomy position, prepped and draped in the usual sterile fashion, and preoperative antibiotics were administered. A preoperative time-out was performed.   I then had to dilate the patient's urethral meatus with Leander Rams sounds starting with a 16 [place] sound and progressing up to 26 [place].  I then was able to pass the rigid ureteroscope through the patient's urethra and into the bladder.  There was no bladder neck contraction or other significant urethral abnormality.  I then advanced the 5 [place] open-ended ureteral catheter through the cystoscope and cannulated the right ureteral orifice.  10 cc of Omnipaque contrast was then instilled into the patient's distal ureter and a retrograde pyelogram performed with the above findings.  I then advanced the 0.038 sensor wire through the open-ended catheter and into the proximal ureter.  The wire did get hung up at the area or level of the obstructing stone and may have actually false passed into the ureteral adventitia.  With manipulation, I did feel like I was able to get it into the right renal pelvis.  I then removed the scope over the wire.  I then advanced a single lumen semirigid ureteroscope through the patient's urethra and into the right distal ureter.  I was able to cannulate the right ureteral orifice and advanced to the level of the iliac vessels without difficulty.  I then was unable to advance over the iliac vessels.  I attempted to place a second 0.038 sensor wire through the ureteroscope and used this as a guide to help get up and over the iliac vessels.  However, I did false passed the ureter and then created a posterior ureteral laceration in this area.  I was able to navigate  beyond this area into the more proximal ureter but still limited by the angle up and over the patient's iliac vessels.  Ultimately, I opted to remove the scope and place a stent to prevent any additional ureteral damage.  As such, the scope was removed and the  cystoscope reintroduced over the guidewire.  I then advanced the 5 [place] open-ended ureteral catheter over the wire removing the wire and injecting contrast again to ensure that I was in the true lumen.  I then advanced a 6 Frenchx 26 cm double-J ureteral stent over the wire and into the renal pelvis on the right under fluoroscopic guidance.  A curl was noted in the renal pelvis after backing out the wire slowly.  I then pulled the cystoscope back to the bladder neck and advanced the stent into the bladder removing a wire once the stent had been advanced into the bladder.  A nice curl was noted at the right ureteral orifice.  The bladder was subsequently emptied.  A B&O suppository was placed in the patient's rectum.  Lidocaine jelly was then instilled into the patient's urethra.  The patient was subsequently awoken and returned to the PACU in stable condition.  Ardis Hughs, M.D.

## 2016-04-17 NOTE — Anesthesia Procedure Notes (Signed)
Procedure Name: Intubation Date/Time: 04/17/2016 3:23 PM Performed by: Bethena Roys T Pre-anesthesia Checklist: Patient identified, Emergency Drugs available, Suction available and Patient being monitored Patient Re-evaluated:Patient Re-evaluated prior to inductionOxygen Delivery Method: Circle system utilized Preoxygenation: Pre-oxygenation with 100% oxygen Intubation Type: IV induction Laryngoscope Size: Mac and 4 Grade View: Grade I Tube type: Oral Tube size: 8.0 mm Number of attempts: 1 Airway Equipment and Method: Stylet and Oral airway Placement Confirmation: ETT inserted through vocal cords under direct vision,  positive ETCO2 and breath sounds checked- equal and bilateral Secured at: 22 cm Tube secured with: Tape Dental Injury: Teeth and Oropharynx as per pre-operative assessment

## 2016-04-17 NOTE — Transfer of Care (Signed)
Immediate Anesthesia Transfer of Care Note  Patient: Allen Rogers  Procedure(s) Performed: Procedure(s): CYSTOSCOPY WITH RETROGRADE PYELOGRAM, URETEROSCOPY AND STENT PLACEMENT (Right)  Patient Location: PACU  Anesthesia Type:General  Level of Consciousness: sedated and responds to stimulation  Airway & Oxygen Therapy: Patient Spontanous Breathing and Patient connected to nasal cannula oxygen  Post-op Assessment: Report given to RN  Post vital signs: Reviewed and stable  Last Vitals: 135/74, 82, 18, 97%, 98.3 Vitals:   04/17/16 1111 04/17/16 1625  BP: (!) 123/92   Pulse: 66   Resp: 16 (P) 13  Temp: 36.6 C (P) 36.8 C    Last Pain:  Vitals:   04/17/16 1111  TempSrc: Oral         Complications: No apparent anesthesia complications

## 2016-04-17 NOTE — H&P (Signed)
Office Visit Report     04/17/2016   --------------------------------------------------------------------------------   Allen Rogers  [id number]  PRIMARY CARE:  Nicoletta Dress, MD  DOB: September 02, 1940, 75 year old Male  REFERRING:    SSN: [id number]  PROVIDER:  Raynelle Bring, M.D.    LOCATION:  [facility] [phone]   --------------------------------------------------------------------------------   CC/HPI: Kidney stone and prostate cancer    He follows up today for a routine research visit for follow-up of his known metastatic castrate resistant prostate cancer. He remains on the SPARTAN trial. This trial was recently unblinded and he is indeed getting study drug. He has denied any unintentional weight loss, night sweats, or other complaints. He denies any side effects from the medication. There have been 2 recent developments of note. He did suffer a fall that was a freak accident where the floorboard gave way. He injured his shin and knee with superficial injuries. Shortly thereafter, he developed right-sided flank pain with associated nausea and vomiting. He has denied any fever.   Incidentally, on his CT scan of the chest, abdomen, and pelvis, he was noted to have a 5 mm mid right ureteral calculus. He does not have a history of prior urolithiasis. He was evaluated last week in our office and placed on medical expulsion therapy. His pain has been better controlled. However, he has had intractable nausea and vomiting and has not been doing very well. He follows up today with a KUB x-ray.     ALLERGIES: No Known Drug Allergies    MEDICATIONS: Tamsulosin Hcl 0.4 mg capsule, ext release 24 hr 1 capsule PO Q HS  Calcium 600 + D TABS Oral  Carisoprodol 350 MG Oral Tablet Oral  Citalopram Hydrobromide TABS 0 Oral Daily  Co Q10 CAPS Oral  Fenofibrate 160 MG Oral Tablet 0 Oral Daily  Fish Oil CAPS Oral  Hydrocodone-Acetaminophen 10-325 MG Oral Tablet Oral   Ibuprofen 800 MG Oral Tablet Oral  Lipitor 40 MG Oral Tablet Oral  Lisinopril 40 MG Oral Tablet 0 Oral  Losartan Potassium-HCTZ 100-25 MG Oral Tablet 1 Oral Twice daily  Multi Vitamin/Minerals Oral Tablet Oral  Omeprazole 20 MG Oral Capsule Delayed Release Oral  OxyCODONE HCl - 20 MG Oral Tablet Oral  ROPINIRole HCl - 4 MG Oral Tablet Oral  Temazepam 15 MG Oral Capsule Oral  Zofran Odt 4 mg tablet,disintegrating 1 tablet PO TID PRN     GU PSH: Simple Scrotal Orchiectomy - 2008      PSH Notes: Knee Repair, Hemilaminectomy, Shoulder Surgery Left, Wrist Surgery, Surgery Of The Retina, Appendectomy, Orchiectomy Bilateral   NON-GU PSH: Appendectomy - 2014 Eye Surgery Procedure - 2014    GU PMH: Calculus Ureter - 04/12/2016 Prostate Cancer, Prostate cancer - 12/14/2015 Testicular hypofunction, Hypogonadism, testicular - 12/14/2015 History of prostate cancer, Prostate Cancer - 2014 Urge incontinence, Urge incontinence of urine - 2014      PMH Notes:   1) Prostate cancer: He is s/p a radical retropubic prostatectomy in 1993 and was found to have a Gleason 7 prostate cancer with a subsequent biochemical recurrence in 1997. He had prostascint scan at that time which demonstrated findings consistent with systemic metastases and has been maintained on intermittent androgen deprivation therapy since then. He began to develop castrate-resistant disease toward the end of 2008 and chose to undergo a bilateral orchiectomy for long-term androgen deprivation. He did have a bone scan in November 2009 due to a fractured T12 vertebrae. Although metastases  could not be ruled out, it was felt to be unlikely. A bone scan in April 2014 was suspicious but not conclusive for metastases to the ribs. He was enrolled on protocol SPARTAN [id number] in December 2014 evaluating 2nd line AR blocker vs placebo for non-metastatic CRPC.   2) Testosterone deficiency: He does take Vitamin D/Calcium supplementation.    Androgen deprivation: Orchiectomy (December 2008)  Last Dexascan: March 2015 - osteopenia   3) Urge incontinence: He has tried Detrol which offered minimal benefit and so he discontinued treatment. He has not wished to pursue other treatments.   NON-GU PMH: Other specified disorders of bone density and structure, unspecified site, Osteopenia - 12/14/2015 Personal history of (healed) traumatic fracture, History of fracture of rib - 2014 Personal history of other diseases of the circulatory system, History of hypertension - 2014 Personal history of other endocrine, nutritional and metabolic disease, History of hypercholesterolemia - 2014    FAMILY HISTORY: Bone Cancer - Runs In Family Death - Mother, Father   SOCIAL HISTORY: Marital Status: Married Current Smoking Status: Patient does not smoke anymore. Smoked for 40 years.  Does not drink anymore.  Drinks 3 caffeinated drinks per day. Patient's occupation is/was Retired.     Notes: Current every day smoker, Tobacco Use   REVIEW OF SYSTEMS:    GU Review Male:   Patient denies frequent urination, hard to postpone urination, burning/ pain with urination, get up at night to urinate, leakage of urine, stream starts and stops, trouble starting your streams, and have to strain to urinate .  Gastrointestinal (Upper):   Patient denies nausea and vomiting.  Gastrointestinal (Lower):   Patient denies diarrhea and constipation.  Constitutional:   Patient denies fever, night sweats, weight loss, and fatigue.  Skin:   Patient denies skin rash/ lesion and itching.  Eyes:   Patient denies blurred vision and double vision.  Ears/ Nose/ Throat:   Patient denies sore throat and sinus problems.  Hematologic/Lymphatic:   Patient denies swollen glands and easy bruising.  Cardiovascular:   Patient denies leg swelling and chest pains.  Respiratory:   Patient denies cough and shortness of breath.  Endocrine:   Patient denies excessive thirst.   Musculoskeletal:   Patient denies back pain and joint pain.  Neurological:   Patient denies headaches and dizziness.  Psychologic:   Patient denies depression and anxiety.   VITAL SIGNS: None   MULTI-SYSTEM PHYSICAL EXAMINATION:    Constitutional: Well-nourished. No physical deformities. Normally developed. Good grooming.  Neck: Neck symmetrical, not swollen. Normal tracheal position.  Respiratory: No labored breathing, no use of accessory muscles.   Cardiovascular: Normal temperature, normal extremity pulses, no swelling, no varicosities.  Lymphatic: No enlargement of neck, axillae, groin.  Skin: No paleness, no jaundice, no cyanosis. No lesion, no ulcer, no rash.  Neurologic / Psychiatric: Oriented to time, oriented to place, oriented to person. No depression, no anxiety, no agitation.  Gastrointestinal: Right flank pain and tendernessof his right lower quadrant on palpation.  Eyes: Normal conjunctivae. Normal eyelids.  Ears, Nose, Mouth, and Throat: Left ear no scars, no lesions, no masses. Right ear no scars, no lesions, no masses. Nose no scars, no lesions, no masses. Normal hearing. Normal lips.  Musculoskeletal: Normal gait and station of head and neck.     PAST DATA REVIEWED:  Source Of History:  Patient  Urine Test Review:   Urinalysis  X-Ray Review: C.T. Chest/ Abd/Pelvis: Reviewed Films.  Bone Scan: Reviewed Films.     06/03/13  09/11/12 07/17/12 02/27/12 04/21/09 11/05/08 06/25/08 04/23/07  PSA  Total PSA 6.92  4.73  4.41  3.01  2.96  2.74  2.62  2.04     07/02/13 07/19/12 02/27/12 04/21/09 11/05/08 06/25/08  Hormones  Testosterone, Total 19  27  28.74  <20.0  46.3  <20.0    Notes:                     I have independently reviewed his bone scan and CT scan. No findings to suggest development of metastatic disease. He does have a 5 mm right ureteral stone as previously noted.   I independently reviewed his KUB. There is a faint calcification over the proximal/mid  ureter on the right side that likely represents a stone although this is not very well visualized.   PROCEDURES:         KUB - 74000  A single view of the abdomen is obtained.               Urinalysis - 81003 Dipstick Dipstick Cont'd  Specimen: Voided Bilirubin: Neg  Color: Yellow Ketones: Neg  Appearance: Clear Blood: Neg  Specific Gravity: 1.020 Protein: Neg  pH: 6.5 Urobilinogen: 0.2  Glucose: Neg Nitrites: Neg    Leukocyte Esterase: Neg    ASSESSMENT:      ICD-10 Details  1 GU:   Prostate Cancer - C61   2   Calculus Ureter - N20.1    PLAN:           Document Letter(s):  Created for Patient: Clinical Summary         Notes:   1. Castrate resistant nonmetastatic prostate cancer: He continues to have a good response to treatment. It is now clear that he is receiving apelutamide. He will continue on protocol.   2. Right ureteral calculus: He has intractable nausea and vomiting and does require treatment. This stone is not well visualized on plain film imaging today. I therefore recommended that he consider ureteroscopic treatment. After reviewing the pros and cons of options, he is agreeable to proceed with right ureteroscopic laser lithotripsy and probable right ureteral stent placement. We have reviewed the potential risks, complications, and the expected recovery process. This procedure will be scheduled with Dr. Louis Meckel later today.   Cc: Dr. Nelda Bucks

## 2016-04-17 NOTE — Anesthesia Preprocedure Evaluation (Addendum)
Anesthesia Evaluation  Patient identified by MRN, date of birth, ID band Patient awake    Reviewed: Allergy & Precautions, NPO status , Patient's Chart, lab work & pertinent test results  History of Anesthesia Complications Negative for: history of anesthetic complications  Airway Mallampati: II  TM Distance: >3 FB Neck ROM: Full    Dental  (+) Edentulous Upper, Edentulous Lower   Pulmonary neg pulmonary ROS,    breath sounds clear to auscultation       Cardiovascular negative cardio ROS   Rhythm:Regular Rate:Normal     Neuro/Psych negative neurological ROS     GI/Hepatic Neg liver ROS, Nausea and vomiting with pyelo   Endo/Other  Morbid obesity  Renal/GU pyelo   Prostate cancer    Musculoskeletal negative musculoskeletal ROS (+)   Abdominal (+) + obese,   Peds  Hematology negative hematology ROS (+)   Anesthesia Other Findings   Reproductive/Obstetrics                            Anesthesia Physical Anesthesia Plan  ASA: II  Anesthesia Plan: General   Post-op Pain Management:    Induction: Intravenous and Rapid sequence  Airway Management Planned: Oral ETT  Additional Equipment:   Intra-op Plan:   Post-operative Plan: Extubation in OR  Informed Consent: I have reviewed the patients History and Physical, chart, labs and discussed the procedure including the risks, benefits and alternatives for the proposed anesthesia with the patient or authorized representative who has indicated his/her understanding and acceptance.     Plan Discussed with: CRNA and Surgeon  Anesthesia Plan Comments: (Plan routine monitors, GETA)        Anesthesia Quick Evaluation

## 2016-04-18 ENCOUNTER — Encounter (HOSPITAL_BASED_OUTPATIENT_CLINIC_OR_DEPARTMENT_OTHER): Payer: Self-pay | Admitting: Urology

## 2016-04-18 NOTE — Anesthesia Postprocedure Evaluation (Signed)
Anesthesia Post Note  Patient: Allen Rogers  Procedure(s) Performed: Procedure(s) (LRB): CYSTOSCOPY WITH RETROGRADE PYELOGRAM, URETEROSCOPY AND STENT PLACEMENT (Right)  Patient location during evaluation: PACU Anesthesia Type: General Level of consciousness: awake and alert Pain management: pain level controlled Vital Signs Assessment: post-procedure vital signs reviewed and stable Respiratory status: spontaneous breathing, nonlabored ventilation, respiratory function stable and patient connected to nasal cannula oxygen Cardiovascular status: blood pressure returned to baseline and stable Postop Assessment: no signs of nausea or vomiting Anesthetic complications: no    Last Vitals:  Vitals:   04/17/16 1715 04/17/16 1926  BP: (!) 149/85 (!) 156/78  Pulse: 64 73  Resp: 16 20  Temp:  37 C    Last Pain:  Vitals:   04/17/16 1715  TempSrc:   PainSc: 0-No pain                 Zenaida Deed

## 2016-04-19 ENCOUNTER — Other Ambulatory Visit: Payer: Self-pay | Admitting: Urology

## 2016-05-02 ENCOUNTER — Encounter (HOSPITAL_COMMUNITY): Payer: Self-pay

## 2016-05-02 ENCOUNTER — Encounter (HOSPITAL_COMMUNITY)
Admission: RE | Admit: 2016-05-02 | Discharge: 2016-05-02 | Disposition: A | Discharge status: Home / Self Care | Payer: Medicare Other | Source: Ambulatory Visit | Attending: Urology | Admitting: Urology

## 2016-05-02 DIAGNOSIS — Z01818 Encounter for other preprocedural examination: Secondary | ICD-10-CM | POA: Insufficient documentation

## 2016-05-02 HISTORY — DX: Calculus of kidney: N20.0

## 2016-05-02 HISTORY — DX: Anxiety disorder, unspecified: F41.9

## 2016-05-02 HISTORY — DX: Major depressive disorder, single episode, unspecified: F32.9

## 2016-05-02 HISTORY — DX: Depression, unspecified: F32.A

## 2016-05-02 HISTORY — DX: Personal history of antineoplastic chemotherapy: Z92.21

## 2016-05-02 LAB — BASIC METABOLIC PANEL
ANION GAP: 6 (ref 5–15)
BUN: 31 mg/dL — ABNORMAL HIGH (ref 6–20)
CALCIUM: 10.1 mg/dL (ref 8.9–10.3)
CO2: 29 mmol/L (ref 22–32)
Chloride: 107 mmol/L (ref 101–111)
Creatinine, Ser: 1.23 mg/dL (ref 0.61–1.24)
GFR, EST NON AFRICAN AMERICAN: 56 mL/min — AB (ref >60)
GLUCOSE: 144 mg/dL — AB (ref 65–99)
POTASSIUM: 4.1 mmol/L (ref 3.5–5.1)
Sodium: 142 mmol/L (ref 135–145)

## 2016-05-02 LAB — CBC
HEMATOCRIT: 37.8 % — AB (ref 39.0–52.0)
Hemoglobin: 12.4 g/dL — ABNORMAL LOW (ref 13.0–17.0)
MCH: 31.5 pg (ref 26.0–34.0)
MCHC: 32.8 g/dL (ref 30.0–36.0)
MCV: 95.9 fL (ref 78.0–100.0)
PLATELETS: 311 10*3/uL (ref 150–400)
RBC: 3.94 MIL/uL — AB (ref 4.22–5.81)
RDW: 13 % (ref 11.5–15.5)
WBC: 3.6 10*3/uL — AB (ref 4.0–10.5)

## 2016-05-02 NOTE — Patient Instructions (Addendum)
Allen Rogers  05/02/2016   Your procedure is scheduled on: Monday May 07, 2016  Report to [facility] Main  Entrance take [place]  elevators to 3rd floor to  [place] at 8:00 AM.  Call this number if you have problems the morning of surgery [phone]   Remember: ONLY 1 PERSON MAY GO WITH YOU TO SHORT STAY TO GET  READY MORNING OF [name].  Do not eat food or drink liquids :After Midnight.     Take these medicines the morning of surgery with A SIP OF WATER: Omeprazole; May take oxycodone or oxycodone-acetaminophen if needed - do not take both.                                You may not have any metal on your body including hair pins and              piercings  Do not wear jewelry,  lotions, powders or colognes, deodorant               Men may shave face and neck.   Do not bring valuables to the hospital. [place].  Contacts, dentures or bridgework may not be worn into surgery.       Patients discharged the day of surgery will not be allowed to drive home.  Name and phone number of your driver:Allen Rogers (wife)  _____________________________________________________________________             [facility] - Preparing for Surgery Before surgery, you can play an important role.  Because skin is not sterile, your skin needs to be as free of germs as possible.  You can reduce the number of germs on your skin by washing with CHG (chlorahexidine gluconate) soap before surgery.  CHG is an antiseptic cleaner which kills germs and bonds with the skin to continue killing germs even after washing. Please DO NOT use if you have an allergy to CHG or antibacterial soaps.  If your skin becomes reddened/irritated stop using the CHG and inform your nurse when you arrive at Short Stay. Do not shave (including legs and underarms) for at least 48 hours prior to the first CHG shower.  You may shave your  face/neck. Please follow these instructions carefully:  1.  Shower with CHG Soap the night before surgery and the  morning of Surgery.  2.  If you choose to wash your hair, wash your hair first as usual with your  normal  shampoo.  3.  After you shampoo, rinse your hair and body thoroughly to remove the  shampoo.                           4.  Use CHG as you would any other liquid soap.  You can apply chg directly  to the skin and wash                       Gently with a scrungie or clean washcloth.  5.  Apply the CHG Soap to your body ONLY FROM THE NECK DOWN.   Do not use on face/ open  Wound or open sores. Avoid contact with eyes, ears mouth and genitals (private parts).                       Wash face,  Genitals (private parts) with your normal soap.             6.  Wash thoroughly, paying special attention to the area where your surgery  will be performed.  7.  Thoroughly rinse your body with warm water from the neck down.  8.  DO NOT shower/wash with your normal soap after using and rinsing off  the CHG Soap.                9.  Pat yourself dry with a clean towel.            10.  Wear clean pajamas.            11.  Place clean sheets on your bed the night of your first shower and do not  sleep with pets. Day of Surgery : Do not apply any lotions/deodorants the morning of surgery.  Please wear clean clothes to the hospital/surgery center.  FAILURE TO FOLLOW THESE INSTRUCTIONS MAY RESULT IN THE CANCELLATION OF YOUR SURGERY PATIENT SIGNATURE_________________________________  NURSE SIGNATURE__________________________________  ________________________________________________________________________

## 2016-05-02 NOTE — Progress Notes (Signed)
BMP results in epic per PAT visit 05/02/2016 sent to Dr Alinda Money

## 2016-05-02 NOTE — Progress Notes (Signed)
Spoke with Dr Corliss Skains in regards to pts H&P, EKG's / epic from 04/08/2016,06/29/2013 11/15/2010 (in which Dr Conrad [place] reviewed). No orders given. Anesthesia to see pt day of surgery.

## 2016-05-07 ENCOUNTER — Encounter (HOSPITAL_COMMUNITY)
Admission: RE | Disposition: A | Discharge status: Home / Self Care | Payer: Self-pay | Source: Ambulatory Visit | Attending: Urology

## 2016-05-07 ENCOUNTER — Encounter (HOSPITAL_COMMUNITY): Payer: Self-pay | Admitting: *Deleted

## 2016-05-07 ENCOUNTER — Ambulatory Visit (HOSPITAL_COMMUNITY): Payer: Medicare Other | Admitting: Anesthesiology

## 2016-05-07 ENCOUNTER — Ambulatory Visit (HOSPITAL_COMMUNITY)
Admission: RE | Admit: 2016-05-07 | Discharge: 2016-05-07 | Disposition: A | Discharge status: Home / Self Care | Payer: Medicare Other | Source: Ambulatory Visit | Attending: Urology | Admitting: Urology

## 2016-05-07 DIAGNOSIS — C61 Malignant neoplasm of prostate: Secondary | ICD-10-CM | POA: Diagnosis not present

## 2016-05-07 DIAGNOSIS — Z791 Long term (current) use of non-steroidal anti-inflammatories (NSAID): Secondary | ICD-10-CM | POA: Diagnosis not present

## 2016-05-07 DIAGNOSIS — Z79891 Long term (current) use of opiate analgesic: Secondary | ICD-10-CM | POA: Insufficient documentation

## 2016-05-07 DIAGNOSIS — G4730 Sleep apnea, unspecified: Secondary | ICD-10-CM | POA: Diagnosis not present

## 2016-05-07 DIAGNOSIS — Z87891 Personal history of nicotine dependence: Secondary | ICD-10-CM | POA: Diagnosis not present

## 2016-05-07 DIAGNOSIS — E7800 Pure hypercholesterolemia, unspecified: Secondary | ICD-10-CM | POA: Insufficient documentation

## 2016-05-07 DIAGNOSIS — K219 Gastro-esophageal reflux disease without esophagitis: Secondary | ICD-10-CM | POA: Insufficient documentation

## 2016-05-07 DIAGNOSIS — I10 Essential (primary) hypertension: Secondary | ICD-10-CM | POA: Diagnosis not present

## 2016-05-07 DIAGNOSIS — I447 Left bundle-branch block, unspecified: Secondary | ICD-10-CM | POA: Diagnosis not present

## 2016-05-07 DIAGNOSIS — Z6833 Body mass index (BMI) 33.0-33.9, adult: Secondary | ICD-10-CM | POA: Diagnosis not present

## 2016-05-07 DIAGNOSIS — Z79899 Other long term (current) drug therapy: Secondary | ICD-10-CM | POA: Diagnosis not present

## 2016-05-07 DIAGNOSIS — Z711 Person with feared health complaint in whom no diagnosis is made: Secondary | ICD-10-CM | POA: Diagnosis not present

## 2016-05-07 DIAGNOSIS — N201 Calculus of ureter: Secondary | ICD-10-CM | POA: Insufficient documentation

## 2016-05-07 HISTORY — PX: CYSTOSCOPY WITH RETROGRADE PYELOGRAM, URETEROSCOPY AND STENT PLACEMENT: SHX5789

## 2016-05-07 SURGERY — CYSTOURETEROSCOPY, WITH RETROGRADE PYELOGRAM AND STENT INSERTION
Anesthesia: General | Laterality: Right

## 2016-05-07 MED ORDER — ONDANSETRON HCL 4 MG/2ML IJ SOLN
INTRAMUSCULAR | Status: DC | PRN
  Administered 2016-05-07: 4 mg via INTRAVENOUS

## 2016-05-07 MED ORDER — FENTANYL CITRATE (PF) 100 MCG/2ML IJ SOLN
INTRAMUSCULAR | Status: AC
  Filled 2016-05-07: qty 2

## 2016-05-07 MED ORDER — PROPOFOL 10 MG/ML IV BOLUS
INTRAVENOUS | Status: AC
  Filled 2016-05-07: qty 20

## 2016-05-07 MED ORDER — LACTATED RINGERS IV SOLN
INTRAVENOUS | Status: DC | PRN
Start: 2016-05-07 — End: 2016-05-07
  Administered 2016-05-07: 09:00:00 via INTRAVENOUS

## 2016-05-07 MED ORDER — HYDROCODONE-ACETAMINOPHEN 5-325 MG PO TABS: 1.0000 | ORAL_TABLET | Freq: Four times a day (QID) | ORAL | 0 refills | Status: AC | PRN

## 2016-05-07 MED ORDER — LACTATED RINGERS IV SOLN: INTRAVENOUS | Status: DC

## 2016-05-07 MED ORDER — CIPROFLOXACIN IN D5W 400 MG/200ML IV SOLN
INTRAVENOUS | Status: AC
  Filled 2016-05-07: qty 200

## 2016-05-07 MED ORDER — SODIUM CHLORIDE 0.9 % IR SOLN
Status: DC | PRN
  Administered 2016-05-07: 3000 mL

## 2016-05-07 MED ORDER — PROPOFOL 10 MG/ML IV BOLUS
INTRAVENOUS | Status: DC | PRN
  Administered 2016-05-07: 200 mg via INTRAVENOUS

## 2016-05-07 MED ORDER — 0.9 % SODIUM CHLORIDE (POUR BTL) OPTIME
TOPICAL | Status: DC | PRN
  Administered 2016-05-07: 1000 mL

## 2016-05-07 MED ORDER — IOHEXOL 300 MG/ML  SOLN
INTRAMUSCULAR | Status: DC | PRN
  Administered 2016-05-07: 10 mL

## 2016-05-07 MED ORDER — LIDOCAINE 2% (20 MG/ML) 5 ML SYRINGE
INTRAMUSCULAR | Status: DC | PRN
  Administered 2016-05-07: 50 mg via INTRAVENOUS

## 2016-05-07 MED ORDER — PHENAZOPYRIDINE HCL 100 MG PO TABS: 100.0000 mg | ORAL_TABLET | Freq: Three times a day (TID) | ORAL | 0 refills | Status: AC | PRN

## 2016-05-07 MED ORDER — EPHEDRINE 5 MG/ML INJ
INTRAVENOUS | Status: AC
  Filled 2016-05-07: qty 10

## 2016-05-07 MED ORDER — HYDROCODONE-ACETAMINOPHEN 5-325 MG PO TABS
2.0000 | ORAL_TABLET | Freq: Once | ORAL | Status: AC
  Administered 2016-05-07: 2 via ORAL
  Filled 2016-05-07: qty 2

## 2016-05-07 MED ORDER — EPHEDRINE SULFATE 50 MG/ML IJ SOLN
INTRAMUSCULAR | Status: DC | PRN
  Administered 2016-05-07: 5 mg via INTRAVENOUS

## 2016-05-07 MED ORDER — FENTANYL CITRATE (PF) 100 MCG/2ML IJ SOLN: 25.0000 ug | INTRAMUSCULAR | Status: DC | PRN

## 2016-05-07 MED ORDER — PROMETHAZINE HCL 25 MG/ML IJ SOLN: 6.2500 mg | INTRAMUSCULAR | Status: DC | PRN

## 2016-05-07 MED ORDER — FENTANYL CITRATE (PF) 100 MCG/2ML IJ SOLN
INTRAMUSCULAR | Status: DC | PRN
  Administered 2016-05-07 (×5): 25 ug via INTRAVENOUS

## 2016-05-07 MED ORDER — CIPROFLOXACIN IN D5W 400 MG/200ML IV SOLN
400.0000 mg | INTRAVENOUS | Status: AC
  Administered 2016-05-07: 400 mg via INTRAVENOUS

## 2016-05-07 SURGICAL SUPPLY — 19 items
BAG URO CATCHER STRL LF (MISCELLANEOUS) ×3 IMPLANT
BASKET ZERO TIP NITINOL 2.4FR (BASKET) IMPLANT
BSKT STON RTRVL ZERO TP 2.4FR (BASKET)
CATH INTERMIT  6FR 70CM (CATHETERS) ×3 IMPLANT
CLOTH BEACON ORANGE TIMEOUT ST (SAFETY) ×3 IMPLANT
FIBER LASER FLEXIVA 365 (UROLOGICAL SUPPLIES) IMPLANT
FIBER LASER TRAC TIP (UROLOGICAL SUPPLIES) IMPLANT
GLOVE BIOGEL M STRL SZ7.5 (GLOVE) ×3 IMPLANT
GOWN STRL REUS W/TWL LRG LVL3 (GOWN DISPOSABLE) ×6 IMPLANT
GUIDEWIRE ANG ZIPWIRE 038X150 (WIRE) IMPLANT
GUIDEWIRE STR DUAL SENSOR (WIRE) ×3 IMPLANT
IV NS 1000ML (IV SOLUTION) ×3
IV NS 1000ML BAXH (IV SOLUTION) ×1 IMPLANT
MANIFOLD NEPTUNE II (INSTRUMENTS) ×3 IMPLANT
PACK CYSTO (CUSTOM PROCEDURE TRAY) ×3 IMPLANT
SHEATH ACCESS URETERAL 38CM (SHEATH) ×2 IMPLANT
STENT URET 6FRX24 CONTOUR (STENTS) ×3 IMPLANT
TUBING CONNECTING 10 (TUBING) ×2 IMPLANT
TUBING CONNECTING 10' (TUBING) ×1

## 2016-05-07 NOTE — H&P (View-Only) (Signed)
Office Visit Report     04/17/2016   --------------------------------------------------------------------------------   Allen Rogers  [id number]  PRIMARY CARE:  Nicoletta Dress, MD  DOB: Aug 13, 1941, 75 year old Male  REFERRING:    SSN: [id number]  PROVIDER:  Raynelle Bring, M.D.    LOCATION:  [facility] [phone]   --------------------------------------------------------------------------------   CC/HPI: Kidney stone and prostate cancer    He follows up today for a routine research visit for follow-up of his known metastatic castrate resistant prostate cancer. He remains on the SPARTAN trial. This trial was recently unblinded and he is indeed getting study drug. He has denied any unintentional weight loss, night sweats, or other complaints. He denies any side effects from the medication. There have been 2 recent developments of note. He did suffer a fall that was a freak accident where the floorboard gave way. He injured his shin and knee with superficial injuries. Shortly thereafter, he developed right-sided flank pain with associated nausea and vomiting. He has denied any fever.   Incidentally, on his CT scan of the chest, abdomen, and pelvis, he was noted to have a 5 mm mid right ureteral calculus. He does not have a history of prior urolithiasis. He was evaluated last week in our office and placed on medical expulsion therapy. His pain has been better controlled. However, he has had intractable nausea and vomiting and has not been doing very well. He follows up today with a KUB x-ray.     ALLERGIES: No Known Drug Allergies    MEDICATIONS: Tamsulosin Hcl 0.4 mg capsule, ext release 24 hr 1 capsule PO Q HS  Calcium 600 + D TABS Oral  Carisoprodol 350 MG Oral Tablet Oral  Citalopram Hydrobromide TABS 0 Oral Daily  Co Q10 CAPS Oral  Fenofibrate 160 MG Oral Tablet 0 Oral Daily  Fish Oil CAPS Oral  Hydrocodone-Acetaminophen 10-325 MG Oral Tablet Oral   Ibuprofen 800 MG Oral Tablet Oral  Lipitor 40 MG Oral Tablet Oral  Lisinopril 40 MG Oral Tablet 0 Oral  Losartan Potassium-HCTZ 100-25 MG Oral Tablet 1 Oral Twice daily  Multi Vitamin/Minerals Oral Tablet Oral  Omeprazole 20 MG Oral Capsule Delayed Release Oral  OxyCODONE HCl - 20 MG Oral Tablet Oral  ROPINIRole HCl - 4 MG Oral Tablet Oral  Temazepam 15 MG Oral Capsule Oral  Zofran Odt 4 mg tablet,disintegrating 1 tablet PO TID PRN     GU PSH: Simple Scrotal Orchiectomy - 2008      PSH Notes: Knee Repair, Hemilaminectomy, Shoulder Surgery Left, Wrist Surgery, Surgery Of The Retina, Appendectomy, Orchiectomy Bilateral   NON-GU PSH: Appendectomy - 2014 Eye Surgery Procedure - 2014    GU PMH: Calculus Ureter - 04/12/2016 Prostate Cancer, Prostate cancer - 12/14/2015 Testicular hypofunction, Hypogonadism, testicular - 12/14/2015 History of prostate cancer, Prostate Cancer - 2014 Urge incontinence, Urge incontinence of urine - 2014      PMH Notes:   1) Prostate cancer: He is s/p a radical retropubic prostatectomy in 1993 and was found to have a Gleason 7 prostate cancer with a subsequent biochemical recurrence in 1997. He had prostascint scan at that time which demonstrated findings consistent with systemic metastases and has been maintained on intermittent androgen deprivation therapy since then. He began to develop castrate-resistant disease toward the end of 2008 and chose to undergo a bilateral orchiectomy for long-term androgen deprivation. He did have a bone scan in November 2009 due to a fractured T12 vertebrae. Although metastases  could not be ruled out, it was felt to be unlikely. A bone scan in April 2014 was suspicious but not conclusive for metastases to the ribs. He was enrolled on protocol SPARTAN [id number] in December 2014 evaluating 2nd line AR blocker vs placebo for non-metastatic CRPC.   2) Testosterone deficiency: He does take Vitamin D/Calcium supplementation.    Androgen deprivation: Orchiectomy (December 2008)  Last Dexascan: March 2015 - osteopenia   3) Urge incontinence: He has tried Detrol which offered minimal benefit and so he discontinued treatment. He has not wished to pursue other treatments.   NON-GU PMH: Other specified disorders of bone density and structure, unspecified site, Osteopenia - 12/14/2015 Personal history of (healed) traumatic fracture, History of fracture of rib - 2014 Personal history of other diseases of the circulatory system, History of hypertension - 2014 Personal history of other endocrine, nutritional and metabolic disease, History of hypercholesterolemia - 2014    FAMILY HISTORY: Bone Cancer - Runs In Family Death - Mother, Father   SOCIAL HISTORY: Marital Status: Married Current Smoking Status: Patient does not smoke anymore. Smoked for 40 years.  Does not drink anymore.  Drinks 3 caffeinated drinks per day. Patient's occupation is/was Retired.     Notes: Current every day smoker, Tobacco Use   REVIEW OF SYSTEMS:    GU Review Male:   Patient denies frequent urination, hard to postpone urination, burning/ pain with urination, get up at night to urinate, leakage of urine, stream starts and stops, trouble starting your streams, and have to strain to urinate .  Gastrointestinal (Upper):   Patient denies nausea and vomiting.  Gastrointestinal (Lower):   Patient denies diarrhea and constipation.  Constitutional:   Patient denies fever, night sweats, weight loss, and fatigue.  Skin:   Patient denies skin rash/ lesion and itching.  Eyes:   Patient denies blurred vision and double vision.  Ears/ Nose/ Throat:   Patient denies sore throat and sinus problems.  Hematologic/Lymphatic:   Patient denies swollen glands and easy bruising.  Cardiovascular:   Patient denies leg swelling and chest pains.  Respiratory:   Patient denies cough and shortness of breath.  Endocrine:   Patient denies excessive thirst.   Musculoskeletal:   Patient denies back pain and joint pain.  Neurological:   Patient denies headaches and dizziness.  Psychologic:   Patient denies depression and anxiety.   VITAL SIGNS: None   MULTI-SYSTEM PHYSICAL EXAMINATION:    Constitutional: Well-nourished. No physical deformities. Normally developed. Good grooming.  Neck: Neck symmetrical, not swollen. Normal tracheal position.  Respiratory: No labored breathing, no use of accessory muscles.   Cardiovascular: Normal temperature, normal extremity pulses, no swelling, no varicosities.  Lymphatic: No enlargement of neck, axillae, groin.  Skin: No paleness, no jaundice, no cyanosis. No lesion, no ulcer, no rash.  Neurologic / Psychiatric: Oriented to time, oriented to place, oriented to person. No depression, no anxiety, no agitation.  Gastrointestinal: Right flank pain and tendernessof his right lower quadrant on palpation.  Eyes: Normal conjunctivae. Normal eyelids.  Ears, Nose, Mouth, and Throat: Left ear no scars, no lesions, no masses. Right ear no scars, no lesions, no masses. Nose no scars, no lesions, no masses. Normal hearing. Normal lips.  Musculoskeletal: Normal gait and station of head and neck.     PAST DATA REVIEWED:  Source Of History:  Patient  Urine Test Review:   Urinalysis  X-Ray Review: C.T. Chest/ Abd/Pelvis: Reviewed Films.  Bone Scan: Reviewed Films.     06/03/13  09/11/12 07/17/12 02/27/12 04/21/09 11/05/08 06/25/08 04/23/07  PSA  Total PSA 6.92  4.73  4.41  3.01  2.96  2.74  2.62  2.04     07/02/13 07/19/12 02/27/12 04/21/09 11/05/08 06/25/08  Hormones  Testosterone, Total 19  27  28.74  <20.0  46.3  <20.0    Notes:                     I have independently reviewed his bone scan and CT scan. No findings to suggest development of metastatic disease. He does have a 5 mm right ureteral stone as previously noted.   I independently reviewed his KUB. There is a faint calcification over the proximal/mid  ureter on the right side that likely represents a stone although this is not very well visualized.   PROCEDURES:         KUB - 74000  A single view of the abdomen is obtained.               Urinalysis - 81003 Dipstick Dipstick Cont'd  Specimen: Voided Bilirubin: Neg  Color: Yellow Ketones: Neg  Appearance: Clear Blood: Neg  Specific Gravity: 1.020 Protein: Neg  pH: 6.5 Urobilinogen: 0.2  Glucose: Neg Nitrites: Neg    Leukocyte Esterase: Neg    ASSESSMENT:      ICD-10 Details  1 GU:   Prostate Cancer - C61   2   Calculus Ureter - N20.1    PLAN:           Document Letter(s):  Created for Patient: Clinical Summary         Notes:   1. Castrate resistant nonmetastatic prostate cancer: He continues to have a good response to treatment. It is now clear that he is receiving apelutamide. He will continue on protocol.   2. Right ureteral calculus: He has intractable nausea and vomiting and does require treatment. This stone is not well visualized on plain film imaging today. I therefore recommended that he consider ureteroscopic treatment. After reviewing the pros and cons of options, he is agreeable to proceed with right ureteroscopic laser lithotripsy and probable right ureteral stent placement. We have reviewed the potential risks, complications, and the expected recovery process. This procedure will be scheduled with Dr. Louis Meckel later today.   Cc: Dr. Nelda Bucks

## 2016-05-07 NOTE — Discharge Instructions (Addendum)
General Anesthesia, Adult, Care After Refer to this sheet in the next few weeks. These instructions provide you with information on caring for yourself after your procedure. Your health care provider may also give you more specific instructions. Your treatment has been planned according to current medical practices, but problems sometimes occur. Call your health care provider if you have any problems or questions after your procedure. WHAT TO EXPECT AFTER THE PROCEDURE After the procedure, it is typical to experience: Sleepiness. Nausea and vomiting. HOME CARE INSTRUCTIONS For the first 24 hours after general anesthesia: Have a responsible person with you. Do not drive a car. If you are alone, do not take public transportation. Do not drink alcohol. Do not take medicine that has not been prescribed by your health care provider. Do not sign important papers or make important decisions. You may resume a normal diet and activities as directed by your health care provider. Change bandages (dressings) as directed. If you have questions or problems that seem related to general anesthesia, call the hospital and ask for the anesthetist or anesthesiologist on call. SEEK MEDICAL CARE IF: You have nausea and vomiting that continue the day after anesthesia. You develop a rash. SEEK IMMEDIATE MEDICAL CARE IF:  You have difficulty breathing. You have chest pain. You have any allergic problems.   This information is not intended to replace advice given to you by your health care provider. Make sure you discuss any questions you have with your health care provider.   Document Released: 11/12/2000 Document Revised: 08/27/2014 Document Reviewed: 12/05/2011 Elsevier Interactive Patient Education 2016 [place] may see some blood in the urine and may have some burning with urination for 48-72 hours. You also may notice that you have to urinate more frequently or urgently after your procedure  which is normal.  2. You should call should you develop an inability urinate, fever > 101, persistent nausea and vomiting that prevents you from eating or drinking to stay hydrated.  3. If you have a stent, you will likely urinate more frequently and urgently until the stent is removed and you may experience some discomfort/pain in the lower abdomen and flank especially when urinating. You may take pain medication prescribed to you if needed for pain. You may also intermittently have blood in the urine until the stent is removed. 4. You may remove your stent on Friday morning.  Simply pull the string that is taped to your body and the stent will easily come out.  This may be best done in the shower as some urine may come out with the stent.  Usually you will feel relief once the stent is removed, but occasionally patients can develop pain due to residual swelling of the ureter that may temporarily obstruct the kidney.  This can be managed by taking pain medication and it will typically resolve with time.  Please do not hesitate to call if you have pain that is not controlled with your pain medication or does not improved within 24-48 hours.

## 2016-05-07 NOTE — Progress Notes (Signed)
Reviewed discharge instructions with patient and wife. To call doctor for fever, pain not relieved by medication, gross bloody urine that does not look to be clearing, unable to urinate. Patient is to pull his stent on Friday at home.

## 2016-05-07 NOTE — Interval H&P Note (Signed)
History and Physical Interval Note:  05/07/2016 9:26 AM  Allen Rogers  has presented today for surgery, with the diagnosis of RIGHT URETERAL CALCULUS.  His stone was unable to be acutely treated at his last operative procedure and he returns today for definitive treatment. The various methods of treatment have been discussed with the patient and family. After consideration of risks, benefits and other options for treatment, the patient has consented to  Procedure(s): CYSTOSCOPY WITH RETROGRADE PYELOGRAM, URETEROSCOPY AND STENT EXCHANGE (Right) HOLMIUM LASER APPLICATION (Right) as a surgical intervention .  The patient's history has been reviewed, patient examined, no change in status, stable for surgery.  I have reviewed the patient's chart and labs.  Questions were answered to the patient's satisfaction.     Xaidyn Kepner,LES

## 2016-05-07 NOTE — Anesthesia Preprocedure Evaluation (Addendum)
Anesthesia Evaluation  Patient identified by MRN, date of birth, ID band Patient awake    Reviewed: Allergy & Precautions, NPO status , Patient's Chart, lab work & pertinent test results  History of Anesthesia Complications Negative for: history of anesthetic complications  Airway Mallampati: II  TM Distance: >3 FB Neck ROM: Full    Dental  (+) Edentulous Upper, Edentulous Lower   Pulmonary neg pulmonary ROS, sleep apnea and Continuous Positive Airway Pressure Ventilation , former smoker,    breath sounds clear to auscultation       Cardiovascular hypertension, negative cardio ROS  + dysrhythmias (LBBB)  Rhythm:Regular Rate:Normal     Neuro/Psych negative neurological ROS     GI/Hepatic Neg liver ROS, GERD  ,  Endo/Other  Morbid obesity  Renal/GU Renal diseasepyelo   Prostate cancer    Musculoskeletal negative musculoskeletal ROS (+)   Abdominal (+) + obese,   Peds  Hematology negative hematology ROS (+)   Anesthesia Other Findings   Reproductive/Obstetrics                            Lab Results  Component Value Date   WBC 3.6 (L) 05/02/2016   HGB 12.4 (L) 05/02/2016   HCT 37.8 (L) 05/02/2016   MCV 95.9 05/02/2016   PLT 311 05/02/2016   Lab Results  Component Value Date   CREATININE 1.23 05/02/2016   BUN 31 (H) 05/02/2016   NA 142 05/02/2016   K 4.1 05/02/2016   CL 107 05/02/2016   CO2 29 05/02/2016    Anesthesia Physical  Anesthesia Plan  ASA: II  Anesthesia Plan: General   Post-op Pain Management:    Induction: Intravenous  Airway Management Planned: LMA  Additional Equipment:   Intra-op Plan:   Post-operative Plan: Extubation in OR  Informed Consent: I have reviewed the patients History and Physical, chart, labs and discussed the procedure including the risks, benefits and alternatives for the proposed anesthesia with the patient or authorized  representative who has indicated his/her understanding and acceptance.     Plan Discussed with: CRNA  Anesthesia Plan Comments:         Anesthesia Quick Evaluation

## 2016-05-07 NOTE — Op Note (Signed)
Preoperative diagnosis: Right ureteral calculus  Postoperative diagnosis: History of right ureteral calculus  Procedure:  1. Cystoscopy 2. Right ureteroscopy  3. Right ureteral stent placement (6 x 24 with string) 4. Right retrograde pyelography with interpretation  Surgeon: Pryor Curia. M.D.  Anesthesia: General  Complications: None  Intraoperative findings: Right retrograde pyelography demonstrated no obvious filling defects in the ureter or renal collecting system.  EBL: Minimal  Specimens: None  Indication: Allen Rogers  is a 75 y.o. patient with urolithiasis. He was recently diagnosed with a mid right ureteral stone.  He underwent ureteral stent placement and attempted right ureteroscopy but his stone was unable to be removed.  His stent was left indwelling to allow for ureteral dilation.  After reviewing the management options for treatment, they elected to proceed with the above surgical procedure(s). We have discussed the potential benefits and risks of the procedure, side effects of the proposed treatment, the likelihood of the patient achieving the goals of the procedure, and any potential problems that might occur during the procedure or recuperation. Informed consent has been obtained.  Description of procedure:  The patient was taken to the operating room and general anesthesia was induced.  The patient was placed in the dorsal lithotomy position, prepped and draped in the usual sterile fashion, and preoperative antibiotics were administered. A preoperative time-out was performed.   Cystourethroscopy was performed.  The patient's urethra was examined and was normal anteriorly.  The prostatic urethra was surgically absent and the posterior urethra was noted to be quite fixed and rigid.  The bladder was then systematically examined in its entirety. There was no evidence for any bladder tumors, stones, or other mucosal pathology except for the indwelling right  ureteral stent.  There right ureteral stent was grasped with flexible graspers and removed to the urethra.   A 0.38 sensor guidewire was then advanced up the right ureter into the renal pelvis under fluoroscopic guidance and the ureteral stent was removed.  Omnipaque contrast was injected through a ureteral catheter that had been placed over the wire and a retrograde pyelogram was performed with findings as dictated above. No clear filling defect was noted and no radioopaque stone was identified.  The semirigid ureteroscope was then advanced into the right ureter after the ureteral safety wire had been replaced.  I was only able to advance the 6 Fr ureteroscope up to the mid ureter due to the rigidity of the posterior urethra.  No stone was visualized. The semirigid scope was then removed.  A 12/14 Fr ureteral access sheath was then advanced over the guide wire up to the mid ureter that had already been visualized. The digital flexible ureteroscope was then advanced through the access sheath into the ureter next to the guidewire.  The flexible scope was then advanced up through the proximal ureter and into the renal collecting system which both were examined very carefully.  There was some debris in the renal collecting system likely old clot or possibly some very small remnant of stone material but no stone was identified.  The entire ureter was again examined upon withdrawing the scope and the access sheath with no stone or other abnormalities seen within the ureter except for small blood clots that were not felt to be obstructing.  The safety wire was then replaced.  The guidewire was backloaded through the cystoscope and a ureteral stent was advance over the wire using Seldinger technique.  The stent was positioned appropriately under fluoroscopic and  cystoscopic guidance.  The wire was then removed with an adequate stent curl noted in the renal pelvis as well as in the bladder. A string was left in place  for later removal by the patient.  The bladder was then emptied and the procedure ended.  The patient appeared to tolerate the procedure well and without complications.  The patient was able to be awakened and transferred to the recovery unit in satisfactory condition.   Pryor Curia MD

## 2016-05-07 NOTE — Transfer of Care (Signed)
Immediate Anesthesia Transfer of Care Note  Patient: Allen Rogers  Procedure(s) Performed: Procedure(s): CYSTOSCOPY WITH RETROGRADE PYELOGRAM, URETEROSCOPY AND STENT EXCHANGE (Right)  Patient Location: PACU  Anesthesia Type:General  Level of Consciousness:  sedated, patient cooperative and responds to stimulation  Airway & Oxygen Therapy:Patient Spontanous Breathing and Patient connected to face mask oxgen  Post-op Assessment:  Report given to PACU RN and Post -op Vital signs reviewed and stable  Post vital signs:  Reviewed and stable  Last Vitals:  Vitals:   05/07/16 0749 05/07/16 1046  BP: 117/62 126/75  Pulse: 73 90  Resp: 18 15  Temp: 36.7 C (P) [id number]    Complications: No apparent anesthesia complications

## 2016-05-07 NOTE — Anesthesia Postprocedure Evaluation (Signed)
Anesthesia Post Note  Patient: Allen Rogers  Procedure(s) Performed: Procedure(s) (LRB): CYSTOSCOPY WITH RETROGRADE PYELOGRAM, URETEROSCOPY AND STENT EXCHANGE (Right)  Patient location during evaluation: PACU Anesthesia Type: General Level of consciousness: awake and alert and patient cooperative Pain management: pain level controlled Vital Signs Assessment: post-procedure vital signs reviewed and stable Respiratory status: spontaneous breathing and respiratory function stable Cardiovascular status: stable Anesthetic complications: no    Last Vitals:  Vitals:   05/07/16 1127 05/07/16 1157  BP: 128/66 (!) 114/57  Pulse: 73 72  Resp:    Temp: 36.4 C     Last Pain:  Vitals:   05/07/16 1228  TempSrc:   PainSc: 2                  Elisea Khader S

## 2016-05-14 DIAGNOSIS — H353211 Exudative age-related macular degeneration, right eye, with active choroidal neovascularization: Secondary | ICD-10-CM | POA: Diagnosis not present

## 2016-06-05 DIAGNOSIS — N201 Calculus of ureter: Secondary | ICD-10-CM | POA: Diagnosis not present

## 2016-06-06 DIAGNOSIS — Z23 Encounter for immunization: Secondary | ICD-10-CM | POA: Diagnosis not present

## 2016-07-02 DIAGNOSIS — H353211 Exudative age-related macular degeneration, right eye, with active choroidal neovascularization: Secondary | ICD-10-CM | POA: Diagnosis not present

## 2016-07-11 DIAGNOSIS — Z1389 Encounter for screening for other disorder: Secondary | ICD-10-CM | POA: Diagnosis not present

## 2016-07-11 DIAGNOSIS — E785 Hyperlipidemia, unspecified: Secondary | ICD-10-CM | POA: Diagnosis not present

## 2016-07-11 DIAGNOSIS — C61 Malignant neoplasm of prostate: Secondary | ICD-10-CM | POA: Diagnosis not present

## 2016-07-11 DIAGNOSIS — G894 Chronic pain syndrome: Secondary | ICD-10-CM | POA: Diagnosis not present

## 2016-07-11 DIAGNOSIS — I10 Essential (primary) hypertension: Secondary | ICD-10-CM | POA: Diagnosis not present

## 2016-07-11 DIAGNOSIS — Z79891 Long term (current) use of opiate analgesic: Secondary | ICD-10-CM | POA: Diagnosis not present

## 2016-07-11 DIAGNOSIS — Z9181 History of falling: Secondary | ICD-10-CM | POA: Diagnosis not present

## 2016-07-11 DIAGNOSIS — F5104 Psychophysiologic insomnia: Secondary | ICD-10-CM | POA: Diagnosis not present

## 2016-07-11 DIAGNOSIS — R7301 Impaired fasting glucose: Secondary | ICD-10-CM | POA: Diagnosis not present

## 2016-07-11 DIAGNOSIS — E559 Vitamin D deficiency, unspecified: Secondary | ICD-10-CM | POA: Diagnosis not present

## 2016-07-23 DIAGNOSIS — H40052 Ocular hypertension, left eye: Secondary | ICD-10-CM | POA: Diagnosis not present

## 2016-08-07 DIAGNOSIS — C61 Malignant neoplasm of prostate: Secondary | ICD-10-CM | POA: Diagnosis not present

## 2016-08-21 DIAGNOSIS — H353211 Exudative age-related macular degeneration, right eye, with active choroidal neovascularization: Secondary | ICD-10-CM | POA: Diagnosis not present

## 2016-09-11 DIAGNOSIS — M25562 Pain in left knee: Secondary | ICD-10-CM | POA: Diagnosis not present

## 2016-09-11 DIAGNOSIS — M170 Bilateral primary osteoarthritis of knee: Secondary | ICD-10-CM | POA: Diagnosis not present

## 2016-09-11 DIAGNOSIS — M25561 Pain in right knee: Secondary | ICD-10-CM | POA: Diagnosis not present

## 2016-09-11 DIAGNOSIS — G8929 Other chronic pain: Secondary | ICD-10-CM | POA: Diagnosis not present

## 2016-10-16 DIAGNOSIS — H353211 Exudative age-related macular degeneration, right eye, with active choroidal neovascularization: Secondary | ICD-10-CM | POA: Diagnosis not present

## 2016-10-16 DIAGNOSIS — H40052 Ocular hypertension, left eye: Secondary | ICD-10-CM | POA: Diagnosis not present

## 2016-12-10 DIAGNOSIS — H40052 Ocular hypertension, left eye: Secondary | ICD-10-CM | POA: Diagnosis not present

## 2016-12-10 DIAGNOSIS — H353211 Exudative age-related macular degeneration, right eye, with active choroidal neovascularization: Secondary | ICD-10-CM | POA: Diagnosis not present

## 2017-01-23 DIAGNOSIS — R7301 Impaired fasting glucose: Secondary | ICD-10-CM | POA: Diagnosis not present

## 2017-01-23 DIAGNOSIS — F5104 Psychophysiologic insomnia: Secondary | ICD-10-CM | POA: Diagnosis not present

## 2017-01-23 DIAGNOSIS — G894 Chronic pain syndrome: Secondary | ICD-10-CM | POA: Diagnosis not present

## 2017-01-23 DIAGNOSIS — Z79891 Long term (current) use of opiate analgesic: Secondary | ICD-10-CM | POA: Diagnosis not present

## 2017-01-23 DIAGNOSIS — C61 Malignant neoplasm of prostate: Secondary | ICD-10-CM | POA: Diagnosis not present

## 2017-01-23 DIAGNOSIS — E785 Hyperlipidemia, unspecified: Secondary | ICD-10-CM | POA: Diagnosis not present

## 2017-01-23 DIAGNOSIS — Z6834 Body mass index (BMI) 34.0-34.9, adult: Secondary | ICD-10-CM | POA: Diagnosis not present

## 2017-01-23 DIAGNOSIS — I10 Essential (primary) hypertension: Secondary | ICD-10-CM | POA: Diagnosis not present

## 2017-01-23 DIAGNOSIS — E559 Vitamin D deficiency, unspecified: Secondary | ICD-10-CM | POA: Diagnosis not present

## 2017-01-25 DIAGNOSIS — H353211 Exudative age-related macular degeneration, right eye, with active choroidal neovascularization: Secondary | ICD-10-CM | POA: Diagnosis not present

## 2017-03-25 DIAGNOSIS — H353211 Exudative age-related macular degeneration, right eye, with active choroidal neovascularization: Secondary | ICD-10-CM | POA: Diagnosis not present

## 2017-05-13 DIAGNOSIS — H353211 Exudative age-related macular degeneration, right eye, with active choroidal neovascularization: Secondary | ICD-10-CM | POA: Diagnosis not present

## 2017-05-16 DIAGNOSIS — H353211 Exudative age-related macular degeneration, right eye, with active choroidal neovascularization: Secondary | ICD-10-CM | POA: Diagnosis not present

## 2017-05-16 DIAGNOSIS — H35361 Drusen (degenerative) of macula, right eye: Secondary | ICD-10-CM | POA: Diagnosis not present

## 2017-05-28 DIAGNOSIS — Z23 Encounter for immunization: Secondary | ICD-10-CM | POA: Diagnosis not present

## 2017-06-03 DIAGNOSIS — Z23 Encounter for immunization: Secondary | ICD-10-CM | POA: Diagnosis not present

## 2017-06-03 DIAGNOSIS — S51801A Unspecified open wound of right forearm, initial encounter: Secondary | ICD-10-CM | POA: Diagnosis not present

## 2017-06-03 DIAGNOSIS — Z6834 Body mass index (BMI) 34.0-34.9, adult: Secondary | ICD-10-CM | POA: Diagnosis not present

## 2017-07-22 DIAGNOSIS — H353211 Exudative age-related macular degeneration, right eye, with active choroidal neovascularization: Secondary | ICD-10-CM | POA: Diagnosis not present

## 2017-08-07 DIAGNOSIS — R7301 Impaired fasting glucose: Secondary | ICD-10-CM | POA: Diagnosis not present

## 2017-08-07 DIAGNOSIS — Z1389 Encounter for screening for other disorder: Secondary | ICD-10-CM | POA: Diagnosis not present

## 2017-08-07 DIAGNOSIS — Z6836 Body mass index (BMI) 36.0-36.9, adult: Secondary | ICD-10-CM | POA: Diagnosis not present

## 2017-08-07 DIAGNOSIS — G894 Chronic pain syndrome: Secondary | ICD-10-CM | POA: Diagnosis not present

## 2017-08-07 DIAGNOSIS — Z9181 History of falling: Secondary | ICD-10-CM | POA: Diagnosis not present

## 2017-08-07 DIAGNOSIS — I10 Essential (primary) hypertension: Secondary | ICD-10-CM | POA: Diagnosis not present

## 2017-08-07 DIAGNOSIS — Z79891 Long term (current) use of opiate analgesic: Secondary | ICD-10-CM | POA: Diagnosis not present

## 2017-08-07 DIAGNOSIS — E785 Hyperlipidemia, unspecified: Secondary | ICD-10-CM | POA: Diagnosis not present

## 2017-08-07 DIAGNOSIS — E559 Vitamin D deficiency, unspecified: Secondary | ICD-10-CM | POA: Diagnosis not present

## 2017-08-07 DIAGNOSIS — C61 Malignant neoplasm of prostate: Secondary | ICD-10-CM | POA: Diagnosis not present

## 2017-08-07 DIAGNOSIS — Z1331 Encounter for screening for depression: Secondary | ICD-10-CM | POA: Diagnosis not present

## 2017-08-07 DIAGNOSIS — F5104 Psychophysiologic insomnia: Secondary | ICD-10-CM | POA: Diagnosis not present

## 2017-09-25 DIAGNOSIS — H353211 Exudative age-related macular degeneration, right eye, with active choroidal neovascularization: Secondary | ICD-10-CM | POA: Diagnosis not present

## 2017-10-30 DIAGNOSIS — M8580 Other specified disorders of bone density and structure, unspecified site: Secondary | ICD-10-CM | POA: Diagnosis not present

## 2017-10-30 DIAGNOSIS — C61 Malignant neoplasm of prostate: Secondary | ICD-10-CM | POA: Diagnosis not present

## 2017-11-25 DIAGNOSIS — H353211 Exudative age-related macular degeneration, right eye, with active choroidal neovascularization: Secondary | ICD-10-CM | POA: Diagnosis not present

## 2017-11-26 DIAGNOSIS — Z6836 Body mass index (BMI) 36.0-36.9, adult: Secondary | ICD-10-CM | POA: Diagnosis not present

## 2017-11-26 DIAGNOSIS — M25561 Pain in right knee: Secondary | ICD-10-CM | POA: Diagnosis not present

## 2017-11-26 DIAGNOSIS — G894 Chronic pain syndrome: Secondary | ICD-10-CM | POA: Diagnosis not present

## 2017-12-12 DIAGNOSIS — M170 Bilateral primary osteoarthritis of knee: Secondary | ICD-10-CM | POA: Diagnosis not present

## 2017-12-20 DIAGNOSIS — M1712 Unilateral primary osteoarthritis, left knee: Secondary | ICD-10-CM | POA: Diagnosis not present

## 2017-12-20 DIAGNOSIS — M1711 Unilateral primary osteoarthritis, right knee: Secondary | ICD-10-CM | POA: Diagnosis not present

## 2017-12-27 DIAGNOSIS — M170 Bilateral primary osteoarthritis of knee: Secondary | ICD-10-CM | POA: Diagnosis not present

## 2018-01-01 IMAGING — NM NM BONE WHOLE BODY
2 series · 2 of 2 positions shown · non-contrast
Comparison: 09/01/2015

CLINICAL DATA: Prostate cancer.

BILLIOT RESEARCH STUDY (CYCLE 33)
EXAM:
NUCLEAR MEDICINE WHOLE BODY BONE SCAN
TECHNIQUE: Whole body anterior and posterior images were obtained approximately
3 hours after intravenous injection of radiopharmaceutical.
RADIOPHARMACEUTICALS:  25.5 mCi Eechnetium-GGm MDP IV

[Series 1: whole body · 2.66mm/px · 1 of 1 slices shown (1 of 2)]
[im 1/1]
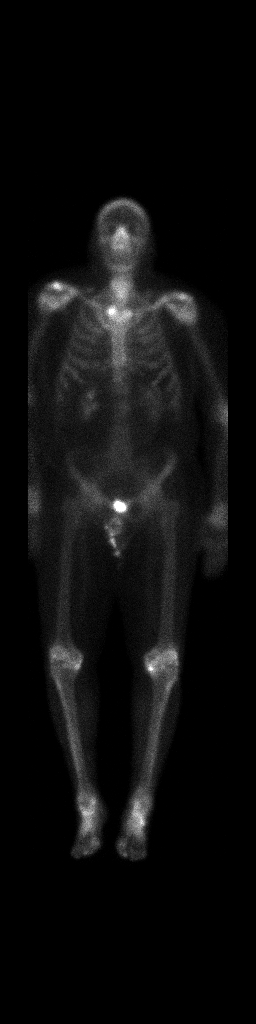

[Series 1: whole body · 2.66mm/px · 1 of 1 slices shown (2 of 2)]
[im 1/1]
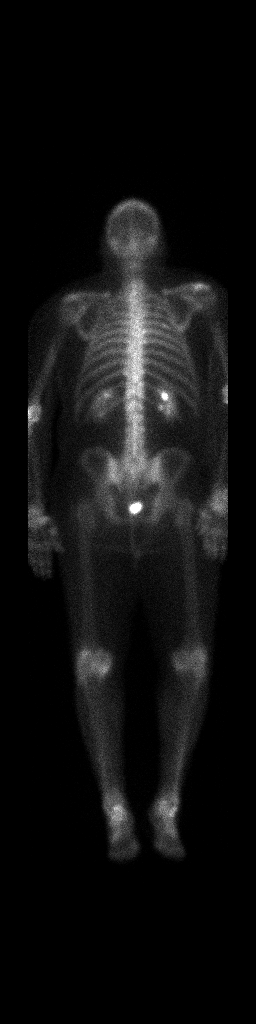

[2 of 2 positions shown; findings below may reference images not displayed]

Baseline 12/11/2013

DNS3S Measurements for Bone Metastasis:

No new skeletal lesion.
FINDINGS: Single focus of uptake within the posterior RIGHT fifth rib is
unchanged. No new foci of radiotracer uptake. Asymmetric uptake at
the RIGHT sternoclavicular joint is not changed and favored
degenerative.
IMPRESSION: No evidence is skeletal metastasis progression.

## 2018-01-31 ENCOUNTER — Other Ambulatory Visit: Payer: Self-pay | Admitting: Urology

## 2018-01-31 DIAGNOSIS — M8580 Other specified disorders of bone density and structure, unspecified site: Secondary | ICD-10-CM

## 2018-02-03 DIAGNOSIS — H353211 Exudative age-related macular degeneration, right eye, with active choroidal neovascularization: Secondary | ICD-10-CM | POA: Diagnosis not present

## 2018-02-05 DIAGNOSIS — R7301 Impaired fasting glucose: Secondary | ICD-10-CM | POA: Diagnosis not present

## 2018-02-05 DIAGNOSIS — E559 Vitamin D deficiency, unspecified: Secondary | ICD-10-CM | POA: Diagnosis not present

## 2018-02-05 DIAGNOSIS — I10 Essential (primary) hypertension: Secondary | ICD-10-CM | POA: Diagnosis not present

## 2018-02-05 DIAGNOSIS — Z79891 Long term (current) use of opiate analgesic: Secondary | ICD-10-CM | POA: Diagnosis not present

## 2018-02-05 DIAGNOSIS — E785 Hyperlipidemia, unspecified: Secondary | ICD-10-CM | POA: Diagnosis not present

## 2018-02-05 DIAGNOSIS — Z6837 Body mass index (BMI) 37.0-37.9, adult: Secondary | ICD-10-CM | POA: Diagnosis not present

## 2018-02-13 ENCOUNTER — Ambulatory Visit
Admission: RE | Admit: 2018-02-13 | Discharge: 2018-02-13 | Disposition: A | Discharge status: Home / Self Care | Payer: Medicare Other | Source: Ambulatory Visit | Attending: Urology | Admitting: Urology

## 2018-02-13 DIAGNOSIS — M85851 Other specified disorders of bone density and structure, right thigh: Secondary | ICD-10-CM | POA: Diagnosis not present

## 2018-02-13 DIAGNOSIS — M85852 Other specified disorders of bone density and structure, left thigh: Secondary | ICD-10-CM | POA: Diagnosis not present

## 2018-02-13 DIAGNOSIS — M8580 Other specified disorders of bone density and structure, unspecified site: Secondary | ICD-10-CM

## 2018-02-16 ENCOUNTER — Emergency Department (HOSPITAL_COMMUNITY)
Admission: EM | Admit: 2018-02-16 | Discharge: 2018-02-16 | Disposition: A | Discharge status: Home / Self Care | Payer: Medicare Other | Attending: Emergency Medicine | Admitting: Emergency Medicine

## 2018-02-16 ENCOUNTER — Emergency Department (HOSPITAL_COMMUNITY): Payer: Medicare Other

## 2018-02-16 ENCOUNTER — Encounter (HOSPITAL_COMMUNITY): Payer: Self-pay | Admitting: Emergency Medicine

## 2018-02-16 DIAGNOSIS — N201 Calculus of ureter: Secondary | ICD-10-CM

## 2018-02-16 DIAGNOSIS — N1330 Unspecified hydronephrosis: Secondary | ICD-10-CM | POA: Diagnosis not present

## 2018-02-16 DIAGNOSIS — Z8546 Personal history of malignant neoplasm of prostate: Secondary | ICD-10-CM | POA: Insufficient documentation

## 2018-02-16 DIAGNOSIS — Z87891 Personal history of nicotine dependence: Secondary | ICD-10-CM | POA: Diagnosis not present

## 2018-02-16 DIAGNOSIS — I10 Essential (primary) hypertension: Secondary | ICD-10-CM | POA: Insufficient documentation

## 2018-02-16 DIAGNOSIS — R109 Unspecified abdominal pain: Secondary | ICD-10-CM | POA: Diagnosis present

## 2018-02-16 DIAGNOSIS — Z79899 Other long term (current) drug therapy: Secondary | ICD-10-CM | POA: Diagnosis not present

## 2018-02-16 LAB — URINALYSIS, ROUTINE W REFLEX MICROSCOPIC
BILIRUBIN URINE: NEGATIVE
Bacteria, UA: NONE SEEN
GLUCOSE, UA: NEGATIVE mg/dL
Ketones, ur: NEGATIVE mg/dL
LEUKOCYTES UA: NEGATIVE
Nitrite: NEGATIVE
PH: 5 (ref 5.0–8.0)
Protein, ur: NEGATIVE mg/dL
Specific Gravity, Urine: 1.02 (ref 1.005–1.030)

## 2018-02-16 LAB — CBC WITH DIFFERENTIAL/PLATELET
BASOS ABS: 0 10*3/uL (ref 0.0–0.1)
BASOS PCT: 0 %
EOS ABS: 0 10*3/uL (ref 0.0–0.7)
Eosinophils Relative: 0 %
HCT: 41.7 % (ref 39.0–52.0)
HEMOGLOBIN: 14.2 g/dL (ref 13.0–17.0)
Lymphocytes Relative: 11 %
Lymphs Abs: 1.1 10*3/uL (ref 0.7–4.0)
MCH: 32.4 pg (ref 26.0–34.0)
MCHC: 34.1 g/dL (ref 30.0–36.0)
MCV: 95.2 fL (ref 78.0–100.0)
MONOS PCT: 10 %
Monocytes Absolute: 1 10*3/uL (ref 0.1–1.0)
NEUTROS PCT: 79 %
Neutro Abs: 7.9 10*3/uL — ABNORMAL HIGH (ref 1.7–7.7)
Platelets: 271 10*3/uL (ref 150–400)
RBC: 4.38 MIL/uL (ref 4.22–5.81)
RDW: 12.6 % (ref 11.5–15.5)
WBC: 10 10*3/uL (ref 4.0–10.5)

## 2018-02-16 LAB — COMPREHENSIVE METABOLIC PANEL
ALK PHOS: 39 U/L (ref 38–126)
ALT: 28 U/L (ref 0–44)
ANION GAP: 11 (ref 5–15)
AST: 33 U/L (ref 15–41)
Albumin: 4 g/dL (ref 3.5–5.0)
BILIRUBIN TOTAL: 0.9 mg/dL (ref 0.3–1.2)
BUN: 29 mg/dL — ABNORMAL HIGH (ref 8–23)
CALCIUM: 9.4 mg/dL (ref 8.9–10.3)
CO2: 27 mmol/L (ref 22–32)
CREATININE: 1.34 mg/dL — AB (ref 0.61–1.24)
Chloride: 103 mmol/L (ref 98–111)
GFR, EST AFRICAN AMERICAN: 59 mL/min — AB (ref >60)
GFR, EST NON AFRICAN AMERICAN: 51 mL/min — AB (ref >60)
Glucose, Bld: 130 mg/dL — ABNORMAL HIGH (ref 70–99)
Potassium: 4 mmol/L (ref 3.5–5.1)
Sodium: 141 mmol/L (ref 135–145)
TOTAL PROTEIN: 6.7 g/dL (ref 6.5–8.1)

## 2018-02-16 NOTE — ED Notes (Signed)
Bed: WA07 Expected date:  Expected time:  Means of arrival:  Comments: 

## 2018-02-16 NOTE — ED Triage Notes (Addendum)
Patient here from home with complaints of left sided flank radiating around to left abdomen. Nausea and vomiting. Hx of same. Prostate cancer.

## 2018-02-16 NOTE — ED Provider Notes (Signed)
[facility] DEPT Provider Note   CSN: [id number] Arrival date & time: 02/16/18  1358     History   Chief Complaint Chief Complaint  Patient presents with  . Flank Pain    HPI Allen Rogers is a 77 y.o. male.  77yo M w/ PMH including prostate CA, HTN, HLD, GERD, kidney stones who p/w L flank pain and vomiting.  This morning at 1 AM, he was awakened from sleep with left flank pain that wraps around to his left side.  The pain has been constant and moderate to severe, currently 4/10 in intensity.  He is taken 20 mg oxycodone twice a day which has improved the pain.  He reports nausea and a few episodes of vomiting.  He endorses constipation, no diarrhea and no fevers.  He denies hematuria, dysuria, or other urinary symptoms.  He had history of stone requiring stenting several years ago and this pain feels similar.  The history is provided by the patient.  Flank Pain     Past Medical History:  Diagnosis Date  . Anxiety   . Arthritis   . Depression   . Full dentures   . GERD (gastroesophageal reflux disease)   . History of chemotherapy   . History of fracture of vertebra    t12  2009  . Hyperlipidemia   . Hypertension   . Hypogonadism, testicular   . Kidney stone   . LBBB (left bundle branch block)   . Lower urinary tract symptoms (LUTS)   . Macular degeneration of both eyes    right eye gets injection's  . OSA on CPAP   . Recurrent prostate carcinoma [facility]) urologist-- dr borden/  oncologist--  dr Tammi Klippel   dx 1993 Gleason 7  s/p  prostatectomy ---  recurrent 1997 and 2008  castrate resistance disease w/ mets  s/p  bilateral orchiectomy/   current treatment SPARTAN trial   . Testosterone deficiency   . Wears glasses     There are no active problems to display for this patient.   Past Surgical History:  Procedure Laterality Date  . APPENDECTOMY  child  . CARPAL TUNNEL RELEASE Bilateral   . CYSTOSCOPY WITH RETROGRADE PYELOGRAM,  URETEROSCOPY AND STENT PLACEMENT Right 04/17/2016   Procedure: CYSTOSCOPY WITH RETROGRADE PYELOGRAM, URETEROSCOPY AND STENT PLACEMENT;  Surgeon: Ardis Hughs, MD;  Location: [facility];  Service: Urology;  Laterality: Right;  . CYSTOSCOPY WITH RETROGRADE PYELOGRAM, URETEROSCOPY AND STENT PLACEMENT Right 05/07/2016   Procedure: CYSTOSCOPY WITH RETROGRADE PYELOGRAM, URETEROSCOPY AND STENT EXCHANGE;  Surgeon: Raynelle Bring, MD;  Location: [facility];  Service: Urology;  Laterality: Right;  . EYE SURGERY     injections per right side   . KNEE ARTHROSCOPY Right 11/15/2010  . LUMBAR SPINE SURGERY  x3  last one ?  . ORIF LEFT JAW FX  1965 or  66   retained hardware  . RADICAL ORCHIECTOMY Bilateral 08/01/2007  . RETROPUBIC PROSTATECTOMY  1993  . SHOULDER SURGERY Left yrs ago  . TONSILLECTOMY AND ADENOIDECTOMY  child        Home Medications    Prior to Admission medications   Medication Sig Start Date End Date Taking? Authorizing Provider  baclofen (LIORESAL) 20 MG tablet Take 20 mg by mouth 3 (three) times daily as needed. For spasm 02/17/16  Yes [provider]  carisoprodol (SOMA) 350 MG tablet Take 350 mg by mouth at bedtime.  02/03/18  Yes [provider]  citalopram (CELEXA)  40 MG tablet Take 40 mg by mouth daily at 6 PM.   Yes [provider]  gabapentin (NEURONTIN) 600 MG tablet Take 600 mg by mouth at bedtime.  11/26/17  Yes [provider]  lisinopril (PRINIVIL,ZESTRIL) 40 MG tablet Take 40 mg by mouth daily at 6 PM.    Yes [provider]  Oxycodone HCl 20 MG TABS Take 20 mg by mouth 4 (four) times daily as needed (for pain.).    Yes [provider]  amLODipine (NORVASC) 5 MG tablet  12/17/17   [provider]  Ascorbic Acid (VITAMIN C) 1000 MG tablet Take 1,000 mg by mouth daily.    [provider]  atorvastatin (LIPITOR) 80 MG tablet Take 80 mg by mouth every evening.  01/31/18   [provider]  Calcium Carb-Cholecalciferol (CALCIUM 600 + D PO) Take 1 tablet by mouth every evening.    [provider]  cholecalciferol (VITAMIN D) 1000 units tablet Take 1,000 Units by mouth daily.    [provider]  dorzolamide-timolol (COSOPT) 22.3-6.8 MG/ML ophthalmic solution INSTILL 1 DROP INTO EACH EYE AT BEDTIME 02/06/18   [provider]  fenofibrate 160 MG tablet Take 160 mg by mouth every morning.    [provider]  HYDROcodone-acetaminophen (NORCO/VICODIN) 5-325 MG tablet Take 1-2 tablets by mouth every 6 (six) hours as needed. Patient not taking: Reported on 02/16/2018 05/07/16   Raynelle Bring, MD  latanoprost (XALATAN) 0.005 % ophthalmic solution INSTILL 1 DROP INTO EACH EYE TWICE DAILY 02/06/18   [provider]  losartan-hydrochlorothiazide (HYZAAR) 100-25 MG tablet Take 1 tablet by mouth daily.     [provider]  Omega-3 Fatty Acids (FISH OIL) 1200 MG CAPS Take 1,200 mg by mouth daily.    [provider]  omeprazole (PRILOSEC) 20 MG capsule Take 20 mg by mouth daily.     [provider]  ondansetron (ZOFRAN-ODT) 4 MG disintegrating tablet Take 4 mg by mouth every 8 (eight) hours as needed for nausea or vomiting.    [provider]  oxyCODONE-acetaminophen (PERCOCET) 10-325 MG tablet Take 1 tablet by mouth every 6 (six) hours as needed. For pain. 03/07/16   [provider]  phenazopyridine (PYRIDIUM) 100 MG tablet Take 1 tablet (100 mg total) by mouth 3 (three) times daily as needed for pain (for burning). Patient not taking: Reported on 02/16/2018 05/07/16   Raynelle Bring, MD  phenazopyridine (PYRIDIUM) 200 MG tablet Take 1 tablet (200 mg total) by mouth 3 (three) times daily as needed for pain. 04/17/16   Ardis Hughs, MD  research study medication Take 1 each by mouth daily at 6 PM. Apalutamide .... [id number] (Dr. Alinda Money Chemotherapy Study)     [provider]  tamsulosin (FLOMAX) 0.4  MG CAPS capsule Take 0.4 mg by mouth daily after supper.    [provider]  temazepam (RESTORIL) 30 MG capsule  01/08/18   [provider]    Family History No family history on file.  Social History Social History   Tobacco Use  . Smoking status: Former Smoker    Packs/day: 1.50    Years: 40.00    Pack years: 60.00    Types: Cigarettes, Cigars, Pipe, E-cigarettes    Last attempt to quit: 04/17/2006    Years since quitting: 11.8  . Smokeless tobacco: Former Systems developer    Types: Chew    Quit date: 04/17/2006  Substance Use Topics  . Alcohol use: No  .  Drug use: No     Allergies   Patient has no known allergies.   Review of Systems Review of Systems  Genitourinary: Positive for flank pain.   All other systems reviewed and are negative except that which was mentioned in HPI   Physical Exam Updated Vital Signs BP (!) 176/94 (BP Location: Left Arm)   Pulse 69   Temp 97.6 F (36.4 C) (Oral)   Resp 18   SpO2 95%   Physical Exam  Constitutional: He is oriented to person, place, and time. He appears well-developed and well-nourished. No distress.  HENT:  Head: Normocephalic and atraumatic.  Moist mucous membranes  Eyes: Conjunctivae are normal.  Neck: Neck supple.  Cardiovascular: Normal rate and regular rhythm.  Murmur heard. Pulmonary/Chest: Effort normal and breath sounds normal.  Abdominal: Soft. Bowel sounds are normal. He exhibits no distension. There is no tenderness.  Musculoskeletal: He exhibits no edema.  Mild L CVA tenderness  Neurological: He is alert and oriented to person, place, and time.  Fluent speech  Skin: Skin is warm and dry.  Psychiatric: He has a normal mood and affect. Judgment normal.  Nursing note and vitals reviewed.    ED Treatments / Results  Labs (all labs ordered are listed, but only abnormal results are displayed) Labs Reviewed  URINALYSIS, ROUTINE W REFLEX MICROSCOPIC - Abnormal; Notable for the following  components:      Result Value   Hgb urine dipstick SMALL (*)    All other components within normal limits  COMPREHENSIVE METABOLIC PANEL - Abnormal; Notable for the following components:   Glucose, Bld 130 (*)    BUN 29 (*)    Creatinine, Ser 1.34 (*)    GFR calc non Af Amer 51 (*)    GFR calc Af Amer 59 (*)    All other components within normal limits  CBC WITH DIFFERENTIAL/PLATELET - Abnormal; Notable for the following components:   Neutro Abs 7.9 (*)    All other components within normal limits    EKG None  Radiology Ct Renal Stone Study  Result Date: 02/16/2018 CLINICAL DATA:  Left-sided flank pain nausea and vomiting EXAM: CT ABDOMEN AND PELVIS WITHOUT CONTRAST TECHNIQUE: Multidetector CT imaging of the abdomen and pelvis was performed following the standard protocol without IV contrast. COMPARISON:  04/12/2016 FINDINGS: Lower chest: Lung bases demonstrate stable 5 and 3 mm lung nodules at the left base. No acute consolidation or pleural effusion. Normal heart size. Hepatobiliary: Layering stones and or sludge in the gallbladder. No focal hepatic abnormality. No biliary dilatation Pancreas: Unremarkable. No pancreatic ductal dilatation or surrounding inflammatory changes. Spleen: Calcified granuloma Adrenals/Urinary Tract: Adrenal glands are within normal limits. Mild left hydronephrosis and hydroureter with perinephric edema. Suspected punctate stones in the distal left ureter, best seen on coronal views about a cm superior to the left UVJ. Stomach/Bowel: Stomach is within normal limits. Appendix not well seen but no right lower quadrant inflammation. Sigmoid colon diverticular disease without acute inflammation. No evidence of bowel wall thickening, distention, or inflammatory changes. Vascular/Lymphatic: Aortic atherosclerosis, moderate. No aneurysmal dilatation. No significantly enlarged lymph nodes. Reproductive: Status post prostatectomy Other: Negative for free air or free fluid.  Left fat containing inguinal hernia Musculoskeletal: Stable compression of T12. IMPRESSION: 1. Mild left hydronephrosis and hydroureter, this is suspected to be secondary to punctate/sandlike stones within the distal left ureter about a cm superior to left UVJ. 2. There are no other acute abnormalities visualized 3. Layering stones and or sludge in  the gallbladder Electronically Signed   By: Donavan Foil M.D.   On: 02/16/2018 19:11    Procedures Procedures (including critical care time)  Medications Ordered in ED Medications - No data to display   Initial Impression / Assessment and Plan / ED Course  I have reviewed the triage vital signs and the nursing notes.  Pertinent labs & imaging results that were available during my care of the patient were reviewed by me and considered in my medical decision making (see chart for details).     Hypertensive but afebrile on exam. No abd tenderness. Labs show Cr 1.34, normal WBC count.  UA without evidence of infection.  Because of his history of obstructing stone requiring intervention, obtain CT which shows mild left hydronephrosis and hydroureter.  Sand-like stones at distal ureter near the UVJ, no large obstructing stones.  He was comfortable on reassessment and did not require any pain or nausea medications in the ED.  I discussed CT findings and supportive measures at home.  Instructed to contact [facility] for follow-up appointment.  Extensively reviewed return precautions including intractable pain, intractable vomiting, or fever.  He voiced understanding and was discharged in satisfactory condition. Final Clinical Impressions(s) / ED Diagnoses   Final diagnoses:  Left ureteral stone    ED Discharge Orders    None       Khaniya Tenaglia, Wenda Overland, MD 02/17/18 0006

## 2018-02-16 NOTE — Discharge Instructions (Addendum)
RETURN TO ER IF YOU DEVELOP FEVER, SEVERE WORSENING OF PAIN NOT RELIEVED BY HOME MEDICATIONS, SEVERE VOMITING, OR URINARY PROBLEMS.

## 2018-04-14 DIAGNOSIS — H353211 Exudative age-related macular degeneration, right eye, with active choroidal neovascularization: Secondary | ICD-10-CM | POA: Diagnosis not present

## 2018-04-19 IMAGING — NM NM BONE WHOLE BODY
2 series · 2 of 2 positions shown · non-contrast
Comparison: 12/26/2015

CLINICAL DATA: **RESEARCH: MANDRE [id number]**

Prostate carcinoma.  Fall 2 weeks prior.
EXAM:
NUCLEAR MEDICINE WHOLE BODY BONE SCAN
TECHNIQUE: Whole body anterior and posterior images were obtained approximately
3 hours after intravenous injection of radiopharmaceutical.
RADIOPHARMACEUTICALS:  20.4 mCi Cechnetium-33m MDP IV

[Series 1: wbr_bone_40 whole body · 2.66mm/px · 1 of 1 slices shown (1 of 2)]
[im 1/1]
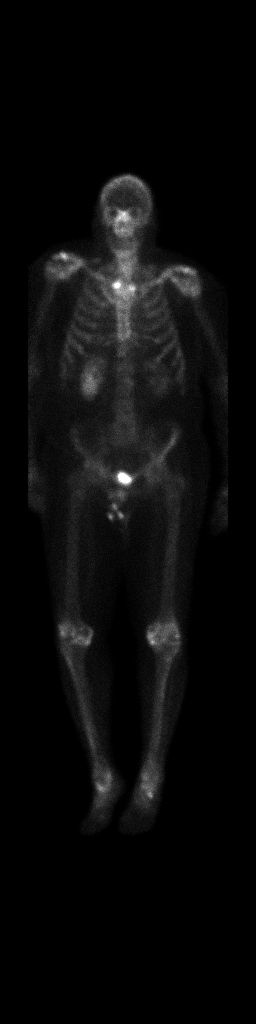

[Series 1: wbr_bone_40 whole body · 2.66mm/px · 1 of 1 slices shown (2 of 2)]
[im 1/1]
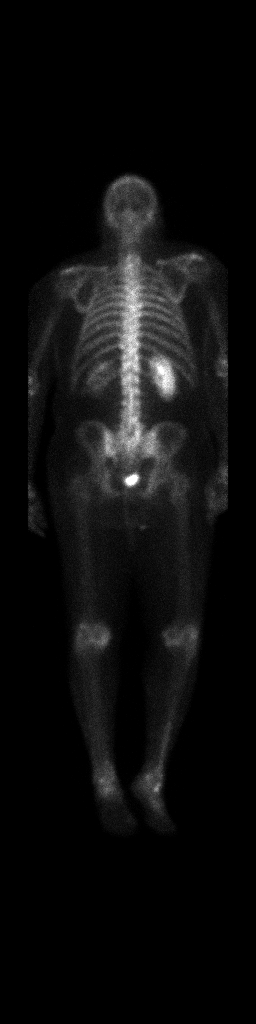

[2 of 2 positions shown; findings below may reference images not displayed]

Baseline 12/11/2013

[id number] MEASUREMENTS FOR BONE METASTASIS:

No new skeletal lesion.
FINDINGS: No new increased radiotracer accumulation to suggest prostate cancer
skeletal metastasis. Degenerate uptake noted in acromioclavicular
joints. Degenerate uptake noted at the RIGHT AC joint.
IMPRESSION: No scintigraphic evidence skeletal metastasis.

## 2018-04-20 DEATH — deceased
